# Patient Record
Sex: Female | Born: 1940 | Race: White | Hispanic: No | Marital: Married | Smoking: Never smoker
Health system: Southern US, Community
[De-identification: ages and names within clinical notes are randomized; demographics above are authoritative.]

## PROBLEM LIST (undated history)

## (undated) DIAGNOSIS — T8859XA Other complications of anesthesia, initial encounter: Secondary | ICD-10-CM

## (undated) DIAGNOSIS — Z9889 Other specified postprocedural states: Secondary | ICD-10-CM

## (undated) DIAGNOSIS — H269 Unspecified cataract: Secondary | ICD-10-CM

## (undated) DIAGNOSIS — T4145XA Adverse effect of unspecified anesthetic, initial encounter: Secondary | ICD-10-CM

## (undated) DIAGNOSIS — C801 Malignant (primary) neoplasm, unspecified: Secondary | ICD-10-CM

## (undated) DIAGNOSIS — C50919 Malignant neoplasm of unspecified site of unspecified female breast: Secondary | ICD-10-CM

## (undated) DIAGNOSIS — M8580 Other specified disorders of bone density and structure, unspecified site: Secondary | ICD-10-CM

## (undated) DIAGNOSIS — R51 Headache: Secondary | ICD-10-CM

## (undated) DIAGNOSIS — R519 Headache, unspecified: Secondary | ICD-10-CM

## (undated) DIAGNOSIS — R112 Nausea with vomiting, unspecified: Secondary | ICD-10-CM

## (undated) DIAGNOSIS — D219 Benign neoplasm of connective and other soft tissue, unspecified: Secondary | ICD-10-CM

## (undated) DIAGNOSIS — C44701 Unspecified malignant neoplasm of skin of unspecified lower limb, including hip: Secondary | ICD-10-CM

## (undated) DIAGNOSIS — E039 Hypothyroidism, unspecified: Secondary | ICD-10-CM

## (undated) HISTORY — DX: Benign neoplasm of connective and other soft tissue, unspecified: D21.9

## (undated) HISTORY — DX: Malignant neoplasm of unspecified site of unspecified female breast: C50.919

## (undated) HISTORY — PX: EYE SURGERY: SHX253

## (undated) HISTORY — DX: Malignant (primary) neoplasm, unspecified: C80.1

## (undated) HISTORY — PX: TONSILLECTOMY: SUR1361

## (undated) HISTORY — PX: BACK SURGERY: SHX140

## (undated) HISTORY — PX: SKIN CANCER EXCISION: SHX779

## (undated) HISTORY — DX: Unspecified malignant neoplasm of skin of unspecified lower limb, including hip: C44.701

## (undated) HISTORY — DX: Other specified disorders of bone density and structure, unspecified site: M85.80

---

## 1982-10-21 HISTORY — PX: THYROIDECTOMY: SHX17

## 1985-12-21 HISTORY — PX: LAMINECTOMY: SHX219

## 1995-12-22 HISTORY — PX: BREAST SURGERY: SHX581

## 1996-09-20 HISTORY — PX: MASTECTOMY: SHX3

## 2000-08-21 HISTORY — PX: LIPOMA EXCISION: SHX5283

## 2009-08-20 ENCOUNTER — Ambulatory Visit (HOSPITAL_COMMUNITY): Admission: RE | Admit: 2009-08-20 | Discharge: 2009-08-20 | Payer: Self-pay | Admitting: Obstetrics and Gynecology

## 2010-08-22 ENCOUNTER — Ambulatory Visit (HOSPITAL_COMMUNITY): Admission: RE | Admit: 2010-08-22 | Discharge: 2010-08-22 | Payer: Self-pay | Admitting: Obstetrics and Gynecology

## 2011-07-31 ENCOUNTER — Other Ambulatory Visit (HOSPITAL_COMMUNITY): Payer: Self-pay | Admitting: Obstetrics and Gynecology

## 2011-07-31 DIAGNOSIS — Z1231 Encounter for screening mammogram for malignant neoplasm of breast: Secondary | ICD-10-CM

## 2011-08-26 ENCOUNTER — Ambulatory Visit (HOSPITAL_COMMUNITY)
Admission: RE | Admit: 2011-08-26 | Discharge: 2011-08-26 | Disposition: A | Discharge status: Home / Self Care | Payer: Medicare Other | Source: Ambulatory Visit | Attending: Obstetrics and Gynecology | Admitting: Obstetrics and Gynecology

## 2011-08-26 DIAGNOSIS — Z1231 Encounter for screening mammogram for malignant neoplasm of breast: Secondary | ICD-10-CM | POA: Insufficient documentation

## 2012-01-11 DIAGNOSIS — S0990XA Unspecified injury of head, initial encounter: Secondary | ICD-10-CM | POA: Diagnosis not present

## 2012-01-16 DIAGNOSIS — H8309 Labyrinthitis, unspecified ear: Secondary | ICD-10-CM | POA: Diagnosis not present

## 2012-01-16 DIAGNOSIS — H6980 Other specified disorders of Eustachian tube, unspecified ear: Secondary | ICD-10-CM | POA: Diagnosis not present

## 2012-02-11 DIAGNOSIS — H6980 Other specified disorders of Eustachian tube, unspecified ear: Secondary | ICD-10-CM | POA: Diagnosis not present

## 2012-03-08 DIAGNOSIS — S0990XA Unspecified injury of head, initial encounter: Secondary | ICD-10-CM | POA: Diagnosis not present

## 2012-08-08 ENCOUNTER — Other Ambulatory Visit: Payer: Self-pay | Admitting: Obstetrics and Gynecology

## 2012-08-08 DIAGNOSIS — H2510 Age-related nuclear cataract, unspecified eye: Secondary | ICD-10-CM | POA: Diagnosis not present

## 2012-08-08 DIAGNOSIS — Z1231 Encounter for screening mammogram for malignant neoplasm of breast: Secondary | ICD-10-CM

## 2012-08-08 DIAGNOSIS — H04129 Dry eye syndrome of unspecified lacrimal gland: Secondary | ICD-10-CM | POA: Diagnosis not present

## 2012-08-23 DIAGNOSIS — Z23 Encounter for immunization: Secondary | ICD-10-CM | POA: Diagnosis not present

## 2012-08-23 DIAGNOSIS — Z131 Encounter for screening for diabetes mellitus: Secondary | ICD-10-CM | POA: Diagnosis not present

## 2012-08-23 DIAGNOSIS — R51 Headache: Secondary | ICD-10-CM | POA: Diagnosis not present

## 2012-08-23 DIAGNOSIS — E559 Vitamin D deficiency, unspecified: Secondary | ICD-10-CM | POA: Diagnosis not present

## 2012-08-23 DIAGNOSIS — E039 Hypothyroidism, unspecified: Secondary | ICD-10-CM | POA: Diagnosis not present

## 2012-08-26 ENCOUNTER — Ambulatory Visit (HOSPITAL_COMMUNITY)
Admission: RE | Admit: 2012-08-26 | Discharge: 2012-08-26 | Disposition: A | Discharge status: Home / Self Care | Payer: Medicare Other | Source: Ambulatory Visit | Attending: Obstetrics and Gynecology | Admitting: Obstetrics and Gynecology

## 2012-08-26 DIAGNOSIS — Z1231 Encounter for screening mammogram for malignant neoplasm of breast: Secondary | ICD-10-CM | POA: Diagnosis not present

## 2012-09-06 ENCOUNTER — Other Ambulatory Visit: Payer: Self-pay | Admitting: Family Medicine

## 2012-09-06 ENCOUNTER — Ambulatory Visit
Admission: RE | Admit: 2012-09-06 | Discharge: 2012-09-06 | Disposition: A | Discharge status: Home / Self Care | Payer: Commercial Managed Care - PPO | Source: Ambulatory Visit | Attending: Family Medicine | Admitting: Family Medicine

## 2012-09-06 ENCOUNTER — Ambulatory Visit
Admission: RE | Admit: 2012-09-06 | Discharge: 2012-09-06 | Disposition: A | Discharge status: Home / Self Care | Payer: Medicare Other | Source: Ambulatory Visit | Attending: Family Medicine | Admitting: Family Medicine

## 2012-09-06 DIAGNOSIS — Z043 Encounter for examination and observation following other accident: Secondary | ICD-10-CM | POA: Diagnosis not present

## 2012-09-06 DIAGNOSIS — R51 Headache: Secondary | ICD-10-CM | POA: Diagnosis not present

## 2012-09-06 MED ORDER — GADOBENATE DIMEGLUMINE 529 MG/ML IV SOLN
15.0000 mL | Freq: Once | INTRAVENOUS | Status: AC | PRN
  Administered 2012-09-06: 15 mL via INTRAVENOUS

## 2012-09-07 ENCOUNTER — Other Ambulatory Visit: Payer: Commercial Managed Care - PPO

## 2012-10-13 DIAGNOSIS — L719 Rosacea, unspecified: Secondary | ICD-10-CM | POA: Diagnosis not present

## 2012-10-13 DIAGNOSIS — L851 Acquired keratosis [keratoderma] palmaris et plantaris: Secondary | ICD-10-CM | POA: Diagnosis not present

## 2012-10-21 ENCOUNTER — Ambulatory Visit: Payer: Medicare Other

## 2012-10-21 ENCOUNTER — Encounter: Payer: Self-pay | Admitting: Obstetrics and Gynecology

## 2012-10-21 ENCOUNTER — Ambulatory Visit (INDEPENDENT_AMBULATORY_CARE_PROVIDER_SITE_OTHER): Payer: Medicare Other | Admitting: Obstetrics and Gynecology

## 2012-10-21 ENCOUNTER — Other Ambulatory Visit: Payer: Medicare Other

## 2012-10-21 VITALS — BP 122/68 | Ht 66.0 in | Wt 184.0 lb

## 2012-10-21 DIAGNOSIS — M8580 Other specified disorders of bone density and structure, unspecified site: Secondary | ICD-10-CM

## 2012-10-21 DIAGNOSIS — Z01419 Encounter for gynecological examination (general) (routine) without abnormal findings: Secondary | ICD-10-CM | POA: Diagnosis not present

## 2012-10-21 DIAGNOSIS — Z853 Personal history of malignant neoplasm of breast: Secondary | ICD-10-CM | POA: Insufficient documentation

## 2012-10-21 DIAGNOSIS — Z124 Encounter for screening for malignant neoplasm of cervix: Secondary | ICD-10-CM

## 2012-10-21 DIAGNOSIS — C801 Malignant (primary) neoplasm, unspecified: Secondary | ICD-10-CM | POA: Insufficient documentation

## 2012-10-21 DIAGNOSIS — M899 Disorder of bone, unspecified: Secondary | ICD-10-CM

## 2012-10-21 DIAGNOSIS — C44701 Unspecified malignant neoplasm of skin of unspecified lower limb, including hip: Secondary | ICD-10-CM | POA: Insufficient documentation

## 2012-10-21 DIAGNOSIS — D219 Benign neoplasm of connective and other soft tissue, unspecified: Secondary | ICD-10-CM | POA: Insufficient documentation

## 2012-10-21 DIAGNOSIS — M949 Disorder of cartilage, unspecified: Secondary | ICD-10-CM | POA: Diagnosis not present

## 2012-10-21 NOTE — Progress Notes (Signed)
The patient is not taking hormone replacement therapy The patient  is taking a Calcium supplement. Post-menopausal bleeding:no  Last Pap: approximate date 10/08/2011 and was normal  Last mammogram: approximate date 08/08/2012 and was normal  Last DEXA scan : T= -0.6  09/18/2010 Last colonoscopy:2010 Due in 2015  Urinary symptoms: none Normal bowel movements: Yes Reports abuse at home: No    Subjective:    Dawn Riggs is a 71 y.o. female G2P2 who presents for annual exam.  The patient has no complaints today.   The following portions of the patient's history were reviewed and updated as appropriate: allergies, current medications, past family history, past medical history, past social history, past surgical history and problem list.  Review of Systems Pertinent items are noted in HPI. Gastrointestinal:No change in bowel habits, no abdominal pain, no rectal bleeding Genitourinary:negative for dysuria, frequency, hematuria, nocturia and urinary incontinence    Objective:     There were no vitals taken for this visit.  Weight:  Wt Readings from Last 1 Encounters:  No data found for Wt     BMI: There is no height or weight on file to calculate BMI. General Appearance: Alert, appropriate appearance for age. No acute distress HEENT: Grossly normal Neck / Thyroid: Supple, no masses, nodes or enlargement Lungs: clear to auscultation bilaterally Back: No CVA tenderness Breast Exam: No masses or nodes.No dimpling, nipple retraction or discharge. Cardiovascular: Regular rate and rhythm. S1, S2, no murmur Gastrointestinal: Soft, non-tender, no masses or organomegaly Pelvic Exam: Vulva and vagina appear normal. Bimanual exam reveals normal uterus and adnexa. Rectovaginal: normal rectal, no masses Lymphatic Exam: Non-palpable nodes in neck, clavicular, axillary, or inguinal regions Skin: no rash or abnormalities Neurologic: Normal gait and speech, no tremor  Psychiatric: Alert and  oriented, appropriate affect.     Assessment:    Normal gyn exam  Breast cancer survivor Osteopenia   Plan:   mammogram pap smear return annually or prn DEXA today:    Silverio Lay MD

## 2012-10-24 LAB — PAP IG W/ RFLX HPV ASCU

## 2013-07-31 ENCOUNTER — Other Ambulatory Visit: Payer: Self-pay | Admitting: Obstetrics and Gynecology

## 2013-07-31 DIAGNOSIS — Z1231 Encounter for screening mammogram for malignant neoplasm of breast: Secondary | ICD-10-CM

## 2013-08-09 DIAGNOSIS — H04129 Dry eye syndrome of unspecified lacrimal gland: Secondary | ICD-10-CM | POA: Diagnosis not present

## 2013-08-09 DIAGNOSIS — H2510 Age-related nuclear cataract, unspecified eye: Secondary | ICD-10-CM | POA: Diagnosis not present

## 2013-08-11 DIAGNOSIS — R1011 Right upper quadrant pain: Secondary | ICD-10-CM | POA: Diagnosis not present

## 2013-08-14 ENCOUNTER — Other Ambulatory Visit: Payer: Self-pay | Admitting: Obstetrics and Gynecology

## 2013-08-14 ENCOUNTER — Other Ambulatory Visit: Payer: Self-pay | Admitting: Family Medicine

## 2013-08-14 DIAGNOSIS — R1011 Right upper quadrant pain: Secondary | ICD-10-CM

## 2013-08-16 ENCOUNTER — Ambulatory Visit
Admission: RE | Admit: 2013-08-16 | Discharge: 2013-08-16 | Disposition: A | Discharge status: Home / Self Care | Payer: Medicare Other | Source: Ambulatory Visit | Attending: Family Medicine | Admitting: Family Medicine

## 2013-08-16 DIAGNOSIS — K8020 Calculus of gallbladder without cholecystitis without obstruction: Secondary | ICD-10-CM | POA: Diagnosis not present

## 2013-08-16 DIAGNOSIS — R1011 Right upper quadrant pain: Secondary | ICD-10-CM

## 2013-08-24 ENCOUNTER — Ambulatory Visit (INDEPENDENT_AMBULATORY_CARE_PROVIDER_SITE_OTHER): Payer: Medicare Other | Admitting: General Surgery

## 2013-08-24 ENCOUNTER — Encounter (INDEPENDENT_AMBULATORY_CARE_PROVIDER_SITE_OTHER): Payer: Self-pay | Admitting: General Surgery

## 2013-08-24 VITALS — BP 132/82 | HR 74 | Resp 18 | Ht 66.5 in | Wt 175.0 lb

## 2013-08-24 DIAGNOSIS — K8020 Calculus of gallbladder without cholecystitis without obstruction: Secondary | ICD-10-CM

## 2013-08-24 NOTE — Patient Instructions (Signed)
Plan for lap chole with ioc 

## 2013-08-24 NOTE — Progress Notes (Signed)
Patient ID: Dawn Riggs, female   DOB: April 02, 1941, 72 y.o.   MRN: [id number]  Chief Complaint  Patient presents with  . Other    Eval gallbladder    HPI Dawn Riggs is a 72 y.o. female.  We are asked to see the patient in consultation by Dr. Corliss Blacker to evaluate her for gallstones. The patient is a 72 year old white female who had her first attack of abdominal pain in February. She has had several attacks since then. Her most recent attack was about 2 weeks ago. She experienced diarrhea and crampy abdominal pain. Some of this pain was localized to the right upper quadrant. The pain occurred about 2 hours after eating dinner. She had severe nausea but no vomiting. It took about 24 hours for the severe pain to go away in about 4-5 days to feel normal again. She had an ultrasound that did show stones in her gallbladder but no gallbladder wall thickening or duct dilatation.  HPI  Past Medical History  Diagnosis Date  . Medullary carcinoma   . Breast cancer   . Osteopenia   . Fibroids   . Cancer of skin of leg     Past Surgical History  Procedure Laterality Date  . Thyroidectomy  [id number]    Dr Levora Dredge  . Laminectomy  [id number]  . Mastectomy  [id number]    Dr Desmond LopeLura Em  . Lipoma excision  08/2000    Family History  Problem Relation Age of Onset  . Hypertension Mother   . Stroke Mother   . Early death Father   . Cancer Brother     lung  . Heart disease Brother   . Cancer Maternal Grandfather     non hodgkins  . Cancer Paternal Grandfather     skin  . Cancer Brother 78    leukemia    Social History History  Substance Use Topics  . Smoking status: Never Smoker   . Smokeless tobacco: Never Used  . Alcohol Use: No    Not on File  Current Outpatient Prescriptions  Medication Sig Dispense Refill  . HYDROcodone-acetaminophen (NORCO/VICODIN) 5-325 MG per tablet Take 1 tablet by mouth every 6 (six) hours as needed for pain.      Marland Kitchen levothyroxine  (UNITHROID) 112 MCG tablet Take 112 mcg by mouth daily.      . Multiple Vitamins-Minerals (CENTRUM SILVER ADULT 50+) TABS Take by mouth.       No current facility-administered medications for this visit.    Review of Systems Review of Systems  Constitutional: Negative.   HENT: Negative.   Eyes: Negative.   Respiratory: Negative.   Cardiovascular: Negative.   Gastrointestinal: Positive for nausea, abdominal pain, diarrhea and abdominal distention.  Endocrine: Negative.   Genitourinary: Negative.   Musculoskeletal: Negative.   Skin: Negative.   Allergic/Immunologic: Negative.   Neurological: Negative.   Hematological: Negative.   Psychiatric/Behavioral: Negative.     Blood pressure 132/82, pulse 74, resp. rate 18, height 5' 6.5" (1.689 m), weight 175 lb (79.379 kg), last menstrual period 12/22/1995.  Physical Exam Physical Exam  Constitutional: She is oriented to person, place, and time. She appears well-developed and well-nourished.  HENT:  Head: Normocephalic and atraumatic.  Eyes: Conjunctivae and EOM are normal. Pupils are equal, round, and reactive to light.  Neck: Normal range of motion. Neck supple.  Cardiovascular: Normal rate, regular rhythm and normal heart sounds.   Pulmonary/Chest: Effort normal and breath sounds normal.  Abdominal: Soft. Bowel sounds are normal.  She exhibits no mass. There is no tenderness.  Musculoskeletal: Normal range of motion.  Neurological: She is alert and oriented to person, place, and time.  Skin: Skin is warm and dry.  Psychiatric: She has a normal mood and affect. Her behavior is normal.    Data Reviewed As above  Assessment    The patient appears to have symptomatic gallstones. Because of the risk of further painful episodes and possible pancreatitis I think she would benefit from having her gallbladder removed. She would also like to have this done. I have discussed with her in detail the risks and benefits of the operation to  remove the gallbladder as well as some of the technical aspects and she understands and wishes to proceed     Plan    Plan for laparoscopic cholecystectomy with intraoperative cholangiogram        TOTH III,Philopateer Strine S 08/24/2013, 2:39 PM

## 2013-08-28 ENCOUNTER — Ambulatory Visit (HOSPITAL_COMMUNITY)
Admission: RE | Admit: 2013-08-28 | Discharge: 2013-08-28 | Disposition: A | Discharge status: Home / Self Care | Payer: Medicare Other | Source: Ambulatory Visit | Attending: Obstetrics and Gynecology | Admitting: Obstetrics and Gynecology

## 2013-08-28 ENCOUNTER — Other Ambulatory Visit: Payer: Self-pay | Admitting: Obstetrics and Gynecology

## 2013-08-28 DIAGNOSIS — Z1231 Encounter for screening mammogram for malignant neoplasm of breast: Secondary | ICD-10-CM | POA: Diagnosis not present

## 2013-08-31 ENCOUNTER — Encounter (HOSPITAL_COMMUNITY): Payer: Self-pay | Admitting: Pharmacy Technician

## 2013-09-04 NOTE — Pre-Procedure Instructions (Signed)
AARIYANA MANZ  09/04/2013   Your procedure is scheduled on:  Thursday, September 18th.  Report to [facility], Main Entrance [facility] "A"at 7:30 AM.  Call this number if you have problems the morning of surgery: [phone]   Remember:   Do not eat food or drink liquids after midnight.    Take these medicines the morning of surgery with A SIP OF WATER: levothyroxine (UNITHROID).  Take if needed: HYDROcodone-acetaminophen (NORCO/VICODIN).  Stop taking Aspirin, Coumadin, Plavix, Effient and Herbal medications.  Do not take any NSAIDs ie: Ibuprofen,  Advil,Naproxen or any medication containing Aspirin.  Do not wear jewelry, make-up or nail polish.  Do not wear lotions, powders, or perfumes. You may wear deodorant.  Do not shave 48 hours prior to surgery.  Do not bring valuables to the hospital.  [facility] is not responsible  for any belongings or valuables.  Contacts, dentures or bridgework may not be worn into surgery.  Leave suitcase in the car. After surgery it may be brought to your room.  For patients admitted to the hospital, checkout time is 11:00 AM the day of discharge.   Patients discharged the day of surgery will not be allowed to drive home.  Name and phone number of your driver: -   Special Instructions: Shower using CHG 2 nights before surgery and the night before surgery.  If you shower the day of surgery use CHG.  Use special wash - you have one bottle of CHG for all showers.  You should use approximately 1/3 of the bottle for each shower.   Please read over the following fact sheets that you were given: Pain Booklet, Coughing and Deep Breathing and Surgical Site Infection Prevention

## 2013-09-05 ENCOUNTER — Encounter (HOSPITAL_COMMUNITY)
Admission: RE | Admit: 2013-09-05 | Discharge: 2013-09-05 | Disposition: A | Discharge status: Home / Self Care | Payer: Medicare Other | Source: Ambulatory Visit | Attending: General Surgery | Admitting: General Surgery

## 2013-09-05 ENCOUNTER — Encounter (HOSPITAL_COMMUNITY): Payer: Self-pay

## 2013-09-05 DIAGNOSIS — Z01812 Encounter for preprocedural laboratory examination: Secondary | ICD-10-CM | POA: Diagnosis not present

## 2013-09-05 DIAGNOSIS — K8020 Calculus of gallbladder without cholecystitis without obstruction: Secondary | ICD-10-CM | POA: Diagnosis not present

## 2013-09-05 DIAGNOSIS — Z853 Personal history of malignant neoplasm of breast: Secondary | ICD-10-CM | POA: Diagnosis not present

## 2013-09-05 HISTORY — DX: Other specified postprocedural states: Z98.890

## 2013-09-05 HISTORY — DX: Adverse effect of unspecified anesthetic, initial encounter: T41.45XA

## 2013-09-05 HISTORY — DX: Hypothyroidism, unspecified: E03.9

## 2013-09-05 HISTORY — DX: Unspecified cataract: H26.9

## 2013-09-05 HISTORY — DX: Nausea with vomiting, unspecified: R11.2

## 2013-09-05 HISTORY — DX: Other complications of anesthesia, initial encounter: T88.59XA

## 2013-09-05 LAB — COMPREHENSIVE METABOLIC PANEL
ALT: 33 U/L (ref 0–35)
AST: 20 U/L (ref 0–37)
Albumin: 3.8 g/dL (ref 3.5–5.2)
Alkaline Phosphatase: 88 U/L (ref 39–117)
CO2: 27 mEq/L (ref 19–32)
Chloride: 99 mEq/L (ref 96–112)
Potassium: 4.1 mEq/L (ref 3.5–5.1)
Sodium: 134 mEq/L — ABNORMAL LOW (ref 135–145)
Total Bilirubin: 0.4 mg/dL (ref 0.3–1.2)

## 2013-09-05 LAB — CBC WITH DIFFERENTIAL/PLATELET
Basophils Absolute: 0 10*3/uL (ref 0.0–0.1)
Basophils Relative: 0 % (ref 0–1)
Lymphocytes Relative: 23 % (ref 12–46)
MCHC: 35.4 g/dL (ref 30.0–36.0)
Neutro Abs: 3.4 10*3/uL (ref 1.7–7.7)
Neutrophils Relative %: 65 % (ref 43–77)
RDW: 13.2 % (ref 11.5–15.5)
WBC: 5.2 10*3/uL (ref 4.0–10.5)

## 2013-09-06 ENCOUNTER — Encounter (HOSPITAL_COMMUNITY): Payer: Self-pay | Admitting: *Deleted

## 2013-09-06 MED ORDER — CEFAZOLIN SODIUM-DEXTROSE 2-3 GM-% IV SOLR
2.0000 g | INTRAVENOUS | Status: AC
  Administered 2013-09-07: 2 g via INTRAVENOUS
  Filled 2013-09-06: qty 50

## 2013-09-07 ENCOUNTER — Ambulatory Visit (HOSPITAL_COMMUNITY): Payer: Medicare Other | Admitting: *Deleted

## 2013-09-07 ENCOUNTER — Encounter (HOSPITAL_COMMUNITY): Payer: Self-pay | Admitting: *Deleted

## 2013-09-07 ENCOUNTER — Ambulatory Visit (HOSPITAL_COMMUNITY)
Admission: RE | Admit: 2013-09-07 | Discharge: 2013-09-07 | Disposition: A | Discharge status: Home / Self Care | Payer: Medicare Other | Source: Ambulatory Visit | Attending: General Surgery | Admitting: General Surgery

## 2013-09-07 ENCOUNTER — Encounter (HOSPITAL_COMMUNITY)
Admission: RE | Disposition: A | Discharge status: Home / Self Care | Payer: Self-pay | Source: Ambulatory Visit | Attending: General Surgery

## 2013-09-07 ENCOUNTER — Ambulatory Visit (HOSPITAL_COMMUNITY): Payer: Medicare Other

## 2013-09-07 DIAGNOSIS — Z853 Personal history of malignant neoplasm of breast: Secondary | ICD-10-CM | POA: Insufficient documentation

## 2013-09-07 DIAGNOSIS — K8020 Calculus of gallbladder without cholecystitis without obstruction: Secondary | ICD-10-CM | POA: Insufficient documentation

## 2013-09-07 DIAGNOSIS — C50919 Malignant neoplasm of unspecified site of unspecified female breast: Secondary | ICD-10-CM | POA: Diagnosis not present

## 2013-09-07 DIAGNOSIS — K8010 Calculus of gallbladder with chronic cholecystitis without obstruction: Secondary | ICD-10-CM | POA: Diagnosis not present

## 2013-09-07 DIAGNOSIS — Z01812 Encounter for preprocedural laboratory examination: Secondary | ICD-10-CM | POA: Diagnosis not present

## 2013-09-07 HISTORY — PX: CHOLECYSTECTOMY: SHX55

## 2013-09-07 SURGERY — LAPAROSCOPIC CHOLECYSTECTOMY WITH INTRAOPERATIVE CHOLANGIOGRAM
Anesthesia: General | Site: Abdomen | Wound class: Clean

## 2013-09-07 MED ORDER — EPHEDRINE SULFATE 50 MG/ML IJ SOLN
INTRAMUSCULAR | Status: DC | PRN
  Administered 2013-09-07: 10 mg via INTRAVENOUS
  Administered 2013-09-07: 5 mg via INTRAVENOUS

## 2013-09-07 MED ORDER — LIDOCAINE HCL (CARDIAC) 20 MG/ML IV SOLN
INTRAVENOUS | Status: DC | PRN
  Administered 2013-09-07: 70 mg via INTRAVENOUS

## 2013-09-07 MED ORDER — BUPIVACAINE HCL (PF) 0.25 % IJ SOLN
INTRAMUSCULAR | Status: AC
  Filled 2013-09-07: qty 30

## 2013-09-07 MED ORDER — SODIUM CHLORIDE 0.9 % IV SOLN
INTRAVENOUS | Status: DC | PRN
  Administered 2013-09-07: 12:00:00

## 2013-09-07 MED ORDER — NEOSTIGMINE METHYLSULFATE 1 MG/ML IJ SOLN
INTRAMUSCULAR | Status: DC | PRN
  Administered 2013-09-07: 3 mg via INTRAVENOUS

## 2013-09-07 MED ORDER — GLYCOPYRROLATE 0.2 MG/ML IJ SOLN
INTRAMUSCULAR | Status: DC | PRN
  Administered 2013-09-07: 0.6 mg via INTRAVENOUS

## 2013-09-07 MED ORDER — MEPERIDINE HCL 25 MG/ML IJ SOLN: 6.2500 mg | INTRAMUSCULAR | Status: DC | PRN

## 2013-09-07 MED ORDER — CHLORHEXIDINE GLUCONATE 4 % EX LIQD: 1.0000 "application " | Freq: Once | CUTANEOUS | Status: DC

## 2013-09-07 MED ORDER — LACTATED RINGERS IV SOLN
INTRAVENOUS | Status: DC
  Administered 2013-09-07: 08:00:00 via INTRAVENOUS

## 2013-09-07 MED ORDER — FENTANYL CITRATE 0.05 MG/ML IJ SOLN
INTRAMUSCULAR | Status: DC | PRN
  Administered 2013-09-07: 50 ug via INTRAVENOUS
  Administered 2013-09-07: 150 ug via INTRAVENOUS

## 2013-09-07 MED ORDER — ROCURONIUM BROMIDE 100 MG/10ML IV SOLN
INTRAVENOUS | Status: DC | PRN
  Administered 2013-09-07: 35 mg via INTRAVENOUS

## 2013-09-07 MED ORDER — OXYCODONE HCL 5 MG PO TABS: 5.0000 mg | ORAL_TABLET | Freq: Once | ORAL | Status: DC | PRN

## 2013-09-07 MED ORDER — ONDANSETRON HCL 4 MG/2ML IJ SOLN
INTRAMUSCULAR | Status: DC | PRN
  Administered 2013-09-07: 4 mg via INTRAVENOUS

## 2013-09-07 MED ORDER — HYDROCODONE-ACETAMINOPHEN 5-325 MG PO TABS: 1.0000 | ORAL_TABLET | ORAL | Status: DC | PRN

## 2013-09-07 MED ORDER — PROPOFOL 10 MG/ML IV BOLUS
INTRAVENOUS | Status: DC | PRN
  Administered 2013-09-07: 200 mg via INTRAVENOUS

## 2013-09-07 MED ORDER — ONDANSETRON HCL 4 MG/2ML IJ SOLN: 4.0000 mg | Freq: Once | INTRAMUSCULAR | Status: DC | PRN

## 2013-09-07 MED ORDER — BUPIVACAINE-EPINEPHRINE 0.25% -1:200000 IJ SOLN
INTRAMUSCULAR | Status: DC | PRN
  Administered 2013-09-07: 28 mL

## 2013-09-07 MED ORDER — OXYCODONE HCL 5 MG/5ML PO SOLN: 5.0000 mg | Freq: Once | ORAL | Status: DC | PRN

## 2013-09-07 MED ORDER — LACTATED RINGERS IV SOLN
INTRAVENOUS | Status: DC | PRN
  Administered 2013-09-07 (×2): via INTRAVENOUS

## 2013-09-07 MED ORDER — 0.9 % SODIUM CHLORIDE (POUR BTL) OPTIME
TOPICAL | Status: DC | PRN
  Administered 2013-09-07: 1000 mL

## 2013-09-07 MED ORDER — SODIUM CHLORIDE 0.9 % IR SOLN
Status: DC | PRN
  Administered 2013-09-07: 1000 mL

## 2013-09-07 MED ORDER — HYDROMORPHONE HCL PF 1 MG/ML IJ SOLN
INTRAMUSCULAR | Status: AC
  Filled 2013-09-07: qty 1

## 2013-09-07 MED ORDER — HYDROMORPHONE HCL PF 1 MG/ML IJ SOLN
0.2500 mg | INTRAMUSCULAR | Status: DC | PRN
  Administered 2013-09-07 (×2): 0.25 mg via INTRAVENOUS
  Administered 2013-09-07: 0.5 mg via INTRAVENOUS

## 2013-09-07 SURGICAL SUPPLY — 44 items
APPLIER CLIP ROT 10 11.4 M/L (STAPLE) ×2
BLADE SURG ROTATE 9660 (MISCELLANEOUS) IMPLANT
CANISTER SUCTION 2500CC (MISCELLANEOUS) ×2 IMPLANT
CATH REDDICK CHOLANGI 4FR 50CM (CATHETERS) ×2 IMPLANT
CHLORAPREP W/TINT 26ML (MISCELLANEOUS) ×2 IMPLANT
CLIP APPLIE ROT 10 11.4 M/L (STAPLE) ×1 IMPLANT
CLOTH BEACON ORANGE TIMEOUT ST (SAFETY) ×2 IMPLANT
COVER MAYO STAND STRL (DRAPES) ×2 IMPLANT
COVER SURGICAL LIGHT HANDLE (MISCELLANEOUS) ×2 IMPLANT
DECANTER SPIKE VIAL GLASS SM (MISCELLANEOUS) ×4 IMPLANT
DERMABOND ADVANCED (GAUZE/BANDAGES/DRESSINGS) ×1
DERMABOND ADVANCED .7 DNX12 (GAUZE/BANDAGES/DRESSINGS) ×1 IMPLANT
DRAPE C-ARM 42X72 X-RAY (DRAPES) ×2 IMPLANT
DRAPE UTILITY 15X26 W/TAPE STR (DRAPE) ×4 IMPLANT
ELECT REM PT RETURN 9FT ADLT (ELECTROSURGICAL) ×2
ELECTRODE REM PT RTRN 9FT ADLT (ELECTROSURGICAL) ×1 IMPLANT
GLOVE BIO SURGEON STRL SZ 6.5 (GLOVE) ×2 IMPLANT
GLOVE BIO SURGEON STRL SZ7.5 (GLOVE) ×6 IMPLANT
GLOVE BIOGEL PI IND STRL 6.5 (GLOVE) ×1 IMPLANT
GLOVE BIOGEL PI IND STRL 8 (GLOVE) ×1 IMPLANT
GLOVE BIOGEL PI INDICATOR 6.5 (GLOVE) ×1
GLOVE BIOGEL PI INDICATOR 8 (GLOVE) ×1
GLOVE ORTHOPEDIC STR SZ6.5 (GLOVE) ×2 IMPLANT
GOWN EXTRA PROTECTION XL (GOWNS) ×2 IMPLANT
GOWN STRL NON-REIN LRG LVL3 (GOWN DISPOSABLE) ×12 IMPLANT
IV CATH 14GX2 1/4 (CATHETERS) ×2 IMPLANT
KIT BASIN OR (CUSTOM PROCEDURE TRAY) ×2 IMPLANT
KIT ROOM TURNOVER OR (KITS) ×2 IMPLANT
NS IRRIG 1000ML POUR BTL (IV SOLUTION) ×2 IMPLANT
PAD ARMBOARD 7.5X6 YLW CONV (MISCELLANEOUS) ×2 IMPLANT
POUCH SPECIMEN RETRIEVAL 10MM (ENDOMECHANICALS) ×2 IMPLANT
SCISSORS LAP 5X35 DISP (ENDOMECHANICALS) ×2 IMPLANT
SET IRRIG TUBING LAPAROSCOPIC (IRRIGATION / IRRIGATOR) ×2 IMPLANT
SLEEVE ENDOPATH XCEL 5M (ENDOMECHANICALS) ×2 IMPLANT
SLEEVE SURGEON STRL (DRAPES) ×2 IMPLANT
SPECIMEN JAR SMALL (MISCELLANEOUS) ×2 IMPLANT
SUT MNCRL AB 4-0 PS2 18 (SUTURE) ×2 IMPLANT
SUT VICRYL 0 UR6 27IN ABS (SUTURE) ×2 IMPLANT
TOWEL OR 17X24 6PK STRL BLUE (TOWEL DISPOSABLE) ×2 IMPLANT
TOWEL OR 17X26 10 PK STRL BLUE (TOWEL DISPOSABLE) ×2 IMPLANT
TRAY LAPAROSCOPIC (CUSTOM PROCEDURE TRAY) ×2 IMPLANT
TROCAR XCEL BLUNT TIP 100MML (ENDOMECHANICALS) ×2 IMPLANT
TROCAR XCEL NON-BLD 11X100MML (ENDOMECHANICALS) ×2 IMPLANT
TROCAR XCEL NON-BLD 5MMX100MML (ENDOMECHANICALS) ×2 IMPLANT

## 2013-09-07 NOTE — H&P (View-Only) (Signed)
Patient ID: Dawn Riggs, female   DOB: 09/24/1941, 72 y.o.   MRN: [id number]  Chief Complaint  Patient presents with  . Other    Eval gallbladder    HPI Dawn Riggs is a 72 y.o. female.  We are asked to see the patient in consultation by Dr. McNeill to evaluate her for gallstones. The patient is a 72-year-old white female who had her first attack of abdominal pain in February. She has had several attacks since then. Her most recent attack was about 2 weeks ago. She experienced diarrhea and crampy abdominal pain. Some of this pain was localized to the right upper quadrant. The pain occurred about 2 hours after eating dinner. She had severe nausea but no vomiting. It took about 24 hours for the severe pain to go away in about 4-5 days to feel normal again. She had an ultrasound that did show stones in her gallbladder but no gallbladder wall thickening or duct dilatation.  HPI  Past Medical History  Diagnosis Date  . Medullary carcinoma   . Breast cancer   . Osteopenia   . Fibroids   . Cancer of skin of leg     Past Surgical History  Procedure Laterality Date  . Thyroidectomy  10-1982    Dr Shine[place]  . Laminectomy  12-1985  . Mastectomy  09-1996    Dr Turk- [place]  . Lipoma excision  08/2000    Family History  Problem Relation Age of Onset  . Hypertension Mother   . Stroke Mother   . Early death Father   . Cancer Brother     lung  . Heart disease Brother   . Cancer Maternal Grandfather     non hodgkins  . Cancer Paternal Grandfather     skin  . Cancer Brother 71    leukemia    Social History History  Substance Use Topics  . Smoking status: Never Smoker   . Smokeless tobacco: Never Used  . Alcohol Use: No    Not on File  Current Outpatient Prescriptions  Medication Sig Dispense Refill  . HYDROcodone-acetaminophen (NORCO/VICODIN) 5-325 MG per tablet Take 1 tablet by mouth every 6 (six) hours as needed for pain.      . levothyroxine  (UNITHROID) 112 MCG tablet Take 112 mcg by mouth daily.      . Multiple Vitamins-Minerals (CENTRUM SILVER ADULT 50+) TABS Take by mouth.       No current facility-administered medications for this visit.    Review of Systems Review of Systems  Constitutional: Negative.   HENT: Negative.   Eyes: Negative.   Respiratory: Negative.   Cardiovascular: Negative.   Gastrointestinal: Positive for nausea, abdominal pain, diarrhea and abdominal distention.  Endocrine: Negative.   Genitourinary: Negative.   Musculoskeletal: Negative.   Skin: Negative.   Allergic/Immunologic: Negative.   Neurological: Negative.   Hematological: Negative.   Psychiatric/Behavioral: Negative.     Blood pressure 132/82, pulse 74, resp. rate 18, height 5' 6.5" (1.689 m), weight 175 lb (79.379 kg), last menstrual period 12/22/1995.  Physical Exam Physical Exam  Constitutional: She is oriented to person, place, and time. She appears well-developed and well-nourished.  HENT:  Head: Normocephalic and atraumatic.  Eyes: Conjunctivae and EOM are normal. Pupils are equal, round, and reactive to light.  Neck: Normal range of motion. Neck supple.  Cardiovascular: Normal rate, regular rhythm and normal heart sounds.   Pulmonary/Chest: Effort normal and breath sounds normal.  Abdominal: Soft. Bowel sounds are normal.   She exhibits no mass. There is no tenderness.  Musculoskeletal: Normal range of motion.  Neurological: She is alert and oriented to person, place, and time.  Skin: Skin is warm and dry.  Psychiatric: She has a normal mood and affect. Her behavior is normal.    Data Reviewed As above  Assessment    The patient appears to have symptomatic gallstones. Because of the risk of further painful episodes and possible pancreatitis I think she would benefit from having her gallbladder removed. She would also like to have this done. I have discussed with her in detail the risks and benefits of the operation to  remove the gallbladder as well as some of the technical aspects and she understands and wishes to proceed     Plan    Plan for laparoscopic cholecystectomy with intraoperative cholangiogram        TOTH III,PAUL S 08/24/2013, 2:39 PM    

## 2013-09-07 NOTE — Interval H&P Note (Signed)
History and Physical Interval Note:  09/07/2013 8:04 AM  Dawn Riggs  has presented today for surgery, with the diagnosis of gallstones  The various methods of treatment have been discussed with the patient and family. After consideration of risks, benefits and other options for treatment, the patient has consented to  Procedure(s): LAPAROSCOPIC CHOLECYSTECTOMY WITH INTRAOPERATIVE CHOLANGIOGRAM (N/A) as a surgical intervention .  The patient's history has been reviewed, patient examined, no change in status, stable for surgery.  I have reviewed the patient's chart and labs.  Questions were answered to the patient's satisfaction.     TOTH III,Adelaide Pfefferkorn S

## 2013-09-07 NOTE — Progress Notes (Signed)
Pt was able to go to bathroom and void without difficulties

## 2013-09-07 NOTE — Op Note (Signed)
09/07/2013  1:04 PM  PATIENT:  Dawn Riggs  72 y.o. female  PRE-OPERATIVE DIAGNOSIS:  gallstones  POST-OPERATIVE DIAGNOSIS:  gallstones  PROCEDURE:  Procedure(s): LAPAROSCOPIC CHOLECYSTECTOMY WITH INTRAOPERATIVE CHOLANGIOGRAM (N/A)  SURGEON:  Surgeon(s) and Role:    * Robyne Askew, MD - Primary    * Atilano Ina, MD - Assisting  PHYSICIAN ASSISTANT:   ASSISTANTS: Dr. Andrey Campanile   ANESTHESIA:   general  EBL:  Total I/O In: 1000 [I.V.:1000] Out: -   BLOOD ADMINISTERED:none  DRAINS: none   LOCAL MEDICATIONS USED:  MARCAINE     SPECIMEN:  Source of Specimen:  gallbladder  DISPOSITION OF SPECIMEN:  PATHOLOGY  COUNTS:  YES  TOURNIQUET:  * No tourniquets in log *  DICTATION: .Dragon Dictation  Procedure: After informed consent was obtained the patient was brought to the operating room and placed in the supine position on the operating room table. After adequate induction of general anesthesia the patient's abdomen was prepped with ChloraPrep allowed to dry and draped in usual sterile manner. The area below the umbilicus was infiltrated with quarter percent  Marcaine. A small incision was made with a 15 blade knife. The incision was carried down through the subcutaneous tissue bluntly with a hemostat and [employer] retractors. The linea alba was identified. The linea alba was incised with a 15 blade knife and each side was grasped with Coker clamps. The preperitoneal space was then probed with a hemostat until the peritoneum was opened and access was gained to the abdominal cavity. A 0 Vicryl pursestring stitch was placed in the fascia surrounding the opening. A Hassan cannula was then placed through the opening and anchored in place with the previously placed Vicryl purse string stitch. The abdomen was insufflated with carbon dioxide without difficulty. A laparoscope was inserted through the [facility] cannula in the right upper quadrant was inspected. Next the epigastric region  was infiltrated with % Marcaine. A small incision was made with a 15 blade knife. A 10 mm port was placed bluntly through this incision into the abdominal cavity under direct vision. Next 2 sites were chosen laterally on the right side of the abdomen for placement of 5 mm ports. Each of these areas was infiltrated with quarter percent Marcaine. Small stab incisions were made with a 15 blade knife. 5 mm ports were then placed bluntly through these incisions into the abdominal cavity under direct vision without difficulty. A blunt grasper was placed through the lateralmost 5 mm port and used to grasp the dome of the gallbladder and elevated anteriorly and superiorly. Another blunt grasper was placed through the other 5 mm port and used to retract the body and neck of the gallbladder. A dissector was placed through the epigastric port and using the electrocautery the peritoneal reflection at the gallbladder neck was opened. Blunt dissection was then carried out in this area until the gallbladder neck-cystic duct junction was readily identified and a good window was created. A single clip was placed on the gallbladder neck. A small  ductotomy was made just below the clip with laparoscopic scissors. A 14-gauge Angiocath was then placed through the anterior abdominal wall under direct vision. A Reddick cholangiogram catheter was then placed through the Angiocath and flushed. The catheter was then placed in the cystic duct and anchored in place with a clip. A cholangiogram was obtained that showed no filling defects good emptying into the duodenum an adequate length on the cystic duct. The anchoring clip and catheters were  then removed from the patient. 3 clips were placed proximally on the cystic duct and the duct was divided between the 2 sets of clips. Posterior to this the cystic artery was identified and again dissected bluntly in a circumferential manner until a good window  was created. 2 clips were placed  proximally and one distally on the artery and the artery was divided between the 2 sets of clips. Next a laparoscopic hook cautery device was used to separate the gallbladder from the liver bed. Prior to completely detaching the gallbladder from the liver bed the liver bed was inspected and several small bleeding points were coagulated with the electrocautery until the area was completely hemostatic. The gallbladder was then detached the rest of it from the liver bed without difficulty. A laparoscopic bag was inserted through the epigastric port. The gallbladder was placed within the bag and the bag was sealed. A laparoscope was then moved to the epigastric port. The gallbladder grasper was placed through the [facility] cannula and used to grasp the opening of the bag. The bag with the gallbladder was then removed with the [facility] cannula through the infraumbilical port without difficulty. The fascial defect was then closed with the previously placed Vicryl pursestring stitch as well as with another figure-of-eight 0 Vicryl stitch. The liver bed was inspected again and found to be hemostatic. The abdomen was irrigated with copious amounts of saline until the effluent was clear. The ports were then removed under direct vision without difficulty and were found to be hemostatic. The gas was allowed to escape. The skin incisions were all closed with interrupted 4-0 Monocryl subcuticular stitches. Dermabond dressings were applied. The patient tolerated the procedure well. At the end of the case all needle sponge and instrument counts were correct. The patient was then awakened and taken to recovery in stable condition  PLAN OF CARE: Discharge to home after PACU  PATIENT DISPOSITION:  PACU - hemodynamically stable.   Delay start of Pharmacological VTE agent (>24hrs) due to surgical blood loss or risk of bleeding: not applicable

## 2013-09-07 NOTE — Preoperative (Signed)
Beta Blockers   Reason not to administer Beta Blockers:Not Applicable 

## 2013-09-07 NOTE — Anesthesia Procedure Notes (Signed)
Performed by: Mikaya Bunner       

## 2013-09-07 NOTE — Transfer of Care (Signed)
Immediate Anesthesia Transfer of Care Note  Patient: Dawn Riggs  Procedure(s) Performed: Procedure(s): LAPAROSCOPIC CHOLECYSTECTOMY WITH INTRAOPERATIVE CHOLANGIOGRAM (N/A)  Patient Location: PACU  Anesthesia Type:General  Level of Consciousness: awake, alert  and oriented  Airway & Oxygen Therapy: Patient Spontanous Breathing and Patient connected to nasal cannula oxygen  Post-op Assessment: Report given to PACU RN and Post -op Vital signs reviewed and stable  Post vital signs: Reviewed and stable  Complications: No apparent anesthesia complications

## 2013-09-07 NOTE — Anesthesia Preprocedure Evaluation (Addendum)
Anesthesia Evaluation  Patient identified by MRN, date of birth, ID band Patient awake    Reviewed: Allergy & Precautions, H&P , NPO status , Patient's Chart, lab work & pertinent test results, reviewed documented beta blocker date and time   History of Anesthesia Complications (+) PONV  Airway Mallampati: I TM Distance: >3 FB Neck ROM: Full    Dental  (+) Teeth Intact   Pulmonary    Pulmonary exam normal       Cardiovascular Rhythm:Regular     Neuro/Psych    GI/Hepatic   Endo/Other  Hypothyroidism   Renal/GU      Musculoskeletal   Abdominal   Peds  Hematology   Anesthesia Other Findings   Reproductive/Obstetrics                          Anesthesia Physical Anesthesia Plan  ASA: II  Anesthesia Plan: General   Post-op Pain Management:    Induction: Intravenous  Airway Management Planned: Oral ETT  Additional Equipment:   Intra-op Plan:   Post-operative Plan: Extubation in OR  Informed Consent: I have reviewed the patients History and Physical, chart, labs and discussed the procedure including the risks, benefits and alternatives for the proposed anesthesia with the patient or authorized representative who has indicated his/her understanding and acceptance.     Plan Discussed with: CRNA and Surgeon  Anesthesia Plan Comments:         Anesthesia Quick Evaluation

## 2013-09-08 NOTE — Anesthesia Postprocedure Evaluation (Signed)
Anesthesia Post Note  Patient: Dawn Riggs  Procedure(s) Performed: Procedure(s) (LRB): LAPAROSCOPIC CHOLECYSTECTOMY WITH INTRAOPERATIVE CHOLANGIOGRAM (N/A)  Anesthesia type: general  Patient location: PACU  Post pain: Pain level controlled  Post assessment: Patient's Cardiovascular Status Stable  Last Vitals:  Filed Vitals:   09/07/13 1600  BP: 133/62  Pulse: 62  Temp: 36.7 C  Resp: 16    Post vital signs: Reviewed and stable  Level of consciousness: sedated  Complications: No apparent anesthesia complications

## 2013-09-11 ENCOUNTER — Encounter (HOSPITAL_COMMUNITY): Payer: Self-pay | Admitting: General Surgery

## 2013-09-11 DIAGNOSIS — Z23 Encounter for immunization: Secondary | ICD-10-CM | POA: Diagnosis not present

## 2013-09-11 DIAGNOSIS — E559 Vitamin D deficiency, unspecified: Secondary | ICD-10-CM | POA: Diagnosis not present

## 2013-09-11 DIAGNOSIS — E039 Hypothyroidism, unspecified: Secondary | ICD-10-CM | POA: Diagnosis not present

## 2013-09-11 DIAGNOSIS — Z131 Encounter for screening for diabetes mellitus: Secondary | ICD-10-CM | POA: Diagnosis not present

## 2013-09-11 DIAGNOSIS — E7800 Pure hypercholesterolemia, unspecified: Secondary | ICD-10-CM | POA: Diagnosis not present

## 2013-09-11 DIAGNOSIS — Z1331 Encounter for screening for depression: Secondary | ICD-10-CM | POA: Diagnosis not present

## 2013-09-26 ENCOUNTER — Ambulatory Visit (INDEPENDENT_AMBULATORY_CARE_PROVIDER_SITE_OTHER): Payer: Medicare Other | Admitting: General Surgery

## 2013-09-26 ENCOUNTER — Encounter (INDEPENDENT_AMBULATORY_CARE_PROVIDER_SITE_OTHER): Payer: Self-pay | Admitting: General Surgery

## 2013-09-26 VITALS — BP 120/70 | HR 62 | Temp 97.4°F | Resp 14 | Ht 66.0 in | Wt 172.0 lb

## 2013-09-26 DIAGNOSIS — K8020 Calculus of gallbladder without cholecystitis without obstruction: Secondary | ICD-10-CM

## 2013-09-26 NOTE — Patient Instructions (Signed)
May return to all normal activities 

## 2013-09-27 ENCOUNTER — Encounter (INDEPENDENT_AMBULATORY_CARE_PROVIDER_SITE_OTHER): Payer: Self-pay | Admitting: General Surgery

## 2013-09-27 NOTE — Progress Notes (Signed)
Subjective:     Patient ID: Dawn Riggs, female   DOB: [birth year], 72 y.o.   MRN: [id number]  HPI The patient is a 72 year old white female who is 2-1/2 weeks status post laparoscopic cholecystectomy for cholecystitis with cholelithiasis. She is feeling well and has no complaints today. Her appetite is good and her bowels are moving regularly. She only occasionally has a little bit of diarrhea.  Review of Systems     Objective:   Physical Exam On exam her abdomen is soft and nontender. Her incisions are all healing nicely with no sign of infection.    Assessment:     The patient is to half weeks status post laparoscopic cholecystectomy     Plan:     At this point she is doing very well. I believe she can return to her normal activities in the next couple weeks. I will plan to see her back on a when necessary basis.

## 2013-10-16 DIAGNOSIS — E039 Hypothyroidism, unspecified: Secondary | ICD-10-CM | POA: Diagnosis not present

## 2013-10-16 DIAGNOSIS — H00019 Hordeolum externum unspecified eye, unspecified eyelid: Secondary | ICD-10-CM | POA: Diagnosis not present

## 2013-10-23 DIAGNOSIS — M899 Disorder of bone, unspecified: Secondary | ICD-10-CM | POA: Diagnosis not present

## 2013-10-23 DIAGNOSIS — Z9189 Other specified personal risk factors, not elsewhere classified: Secondary | ICD-10-CM | POA: Diagnosis not present

## 2013-10-23 DIAGNOSIS — C50919 Malignant neoplasm of unspecified site of unspecified female breast: Secondary | ICD-10-CM | POA: Diagnosis not present

## 2013-10-23 DIAGNOSIS — Z124 Encounter for screening for malignant neoplasm of cervix: Secondary | ICD-10-CM | POA: Diagnosis not present

## 2014-03-26 DIAGNOSIS — M76829 Posterior tibial tendinitis, unspecified leg: Secondary | ICD-10-CM | POA: Diagnosis not present

## 2014-03-30 DIAGNOSIS — M76829 Posterior tibial tendinitis, unspecified leg: Secondary | ICD-10-CM | POA: Diagnosis not present

## 2014-04-03 DIAGNOSIS — M76829 Posterior tibial tendinitis, unspecified leg: Secondary | ICD-10-CM | POA: Diagnosis not present

## 2014-04-05 DIAGNOSIS — M76829 Posterior tibial tendinitis, unspecified leg: Secondary | ICD-10-CM | POA: Diagnosis not present

## 2014-04-10 DIAGNOSIS — M76829 Posterior tibial tendinitis, unspecified leg: Secondary | ICD-10-CM | POA: Diagnosis not present

## 2014-04-12 DIAGNOSIS — M76829 Posterior tibial tendinitis, unspecified leg: Secondary | ICD-10-CM | POA: Diagnosis not present

## 2014-04-17 DIAGNOSIS — M76829 Posterior tibial tendinitis, unspecified leg: Secondary | ICD-10-CM | POA: Diagnosis not present

## 2014-04-19 DIAGNOSIS — H00019 Hordeolum externum unspecified eye, unspecified eyelid: Secondary | ICD-10-CM | POA: Diagnosis not present

## 2014-04-19 DIAGNOSIS — M76829 Posterior tibial tendinitis, unspecified leg: Secondary | ICD-10-CM | POA: Diagnosis not present

## 2014-04-19 DIAGNOSIS — E039 Hypothyroidism, unspecified: Secondary | ICD-10-CM | POA: Diagnosis not present

## 2014-05-28 DIAGNOSIS — M722 Plantar fascial fibromatosis: Secondary | ICD-10-CM | POA: Diagnosis not present

## 2014-07-30 ENCOUNTER — Other Ambulatory Visit (HOSPITAL_COMMUNITY): Payer: Self-pay | Admitting: Obstetrics and Gynecology

## 2014-07-30 DIAGNOSIS — Z1231 Encounter for screening mammogram for malignant neoplasm of breast: Secondary | ICD-10-CM

## 2014-08-29 ENCOUNTER — Ambulatory Visit (HOSPITAL_COMMUNITY)
Admission: RE | Admit: 2014-08-29 | Discharge: 2014-08-29 | Disposition: A | Discharge status: Home / Self Care | Payer: Medicare Other | Source: Ambulatory Visit | Attending: Obstetrics and Gynecology | Admitting: Obstetrics and Gynecology

## 2014-08-29 ENCOUNTER — Other Ambulatory Visit (HOSPITAL_COMMUNITY): Payer: Self-pay | Admitting: Obstetrics and Gynecology

## 2014-08-29 DIAGNOSIS — Z1231 Encounter for screening mammogram for malignant neoplasm of breast: Secondary | ICD-10-CM

## 2014-10-22 ENCOUNTER — Encounter (INDEPENDENT_AMBULATORY_CARE_PROVIDER_SITE_OTHER): Payer: Self-pay | Admitting: General Surgery

## 2014-10-22 DIAGNOSIS — E039 Hypothyroidism, unspecified: Secondary | ICD-10-CM | POA: Diagnosis not present

## 2014-10-22 DIAGNOSIS — E559 Vitamin D deficiency, unspecified: Secondary | ICD-10-CM | POA: Diagnosis not present

## 2014-10-24 DIAGNOSIS — M899 Disorder of bone, unspecified: Secondary | ICD-10-CM | POA: Diagnosis not present

## 2014-10-24 DIAGNOSIS — Z124 Encounter for screening for malignant neoplasm of cervix: Secondary | ICD-10-CM | POA: Diagnosis not present

## 2014-10-24 DIAGNOSIS — Z853 Personal history of malignant neoplasm of breast: Secondary | ICD-10-CM | POA: Diagnosis not present

## 2014-10-25 DIAGNOSIS — E039 Hypothyroidism, unspecified: Secondary | ICD-10-CM | POA: Diagnosis not present

## 2014-10-25 DIAGNOSIS — M722 Plantar fascial fibromatosis: Secondary | ICD-10-CM | POA: Diagnosis not present

## 2014-10-25 DIAGNOSIS — H0011 Chalazion right upper eyelid: Secondary | ICD-10-CM | POA: Diagnosis not present

## 2014-10-25 DIAGNOSIS — E559 Vitamin D deficiency, unspecified: Secondary | ICD-10-CM | POA: Diagnosis not present

## 2014-10-25 DIAGNOSIS — Z23 Encounter for immunization: Secondary | ICD-10-CM | POA: Diagnosis not present

## 2014-11-09 DIAGNOSIS — H0011 Chalazion right upper eyelid: Secondary | ICD-10-CM | POA: Diagnosis not present

## 2014-11-09 DIAGNOSIS — H2513 Age-related nuclear cataract, bilateral: Secondary | ICD-10-CM | POA: Diagnosis not present

## 2014-11-26 DIAGNOSIS — Z09 Encounter for follow-up examination after completed treatment for conditions other than malignant neoplasm: Secondary | ICD-10-CM | POA: Diagnosis not present

## 2014-11-26 DIAGNOSIS — Z86010 Personal history of colonic polyps: Secondary | ICD-10-CM | POA: Diagnosis not present

## 2014-11-26 DIAGNOSIS — D179 Benign lipomatous neoplasm, unspecified: Secondary | ICD-10-CM | POA: Diagnosis not present

## 2014-11-26 DIAGNOSIS — D175 Benign lipomatous neoplasm of intra-abdominal organs: Secondary | ICD-10-CM | POA: Diagnosis not present

## 2014-11-26 DIAGNOSIS — K641 Second degree hemorrhoids: Secondary | ICD-10-CM | POA: Diagnosis not present

## 2014-11-26 DIAGNOSIS — Z1211 Encounter for screening for malignant neoplasm of colon: Secondary | ICD-10-CM | POA: Diagnosis not present

## 2014-12-11 IMAGING — US US ABDOMEN COMPLETE
1 series · 14 of 25 positions shown · non-contrast
Comparison: None.

` *RADIOLOGY REPORT*
CLINICAL DATA: Right upper abdominal pain.

COMPLETE ABDOMINAL ULTRASOUND

[Series 1: us abdomen complete · 0.23mm/px · 14 of 76 slices shown]
[im 1/76]
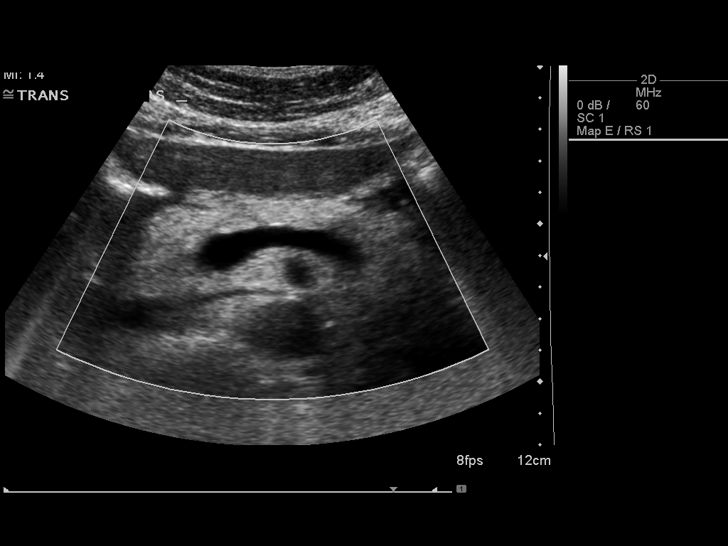
[im 7/76]
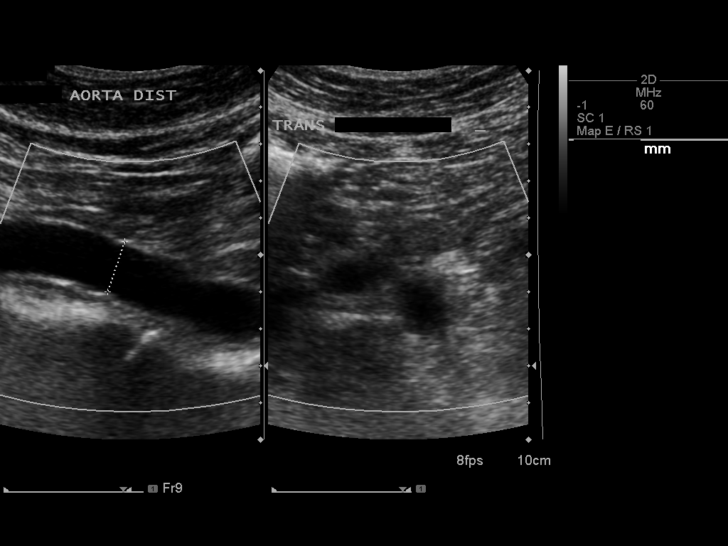
[im 13/76]
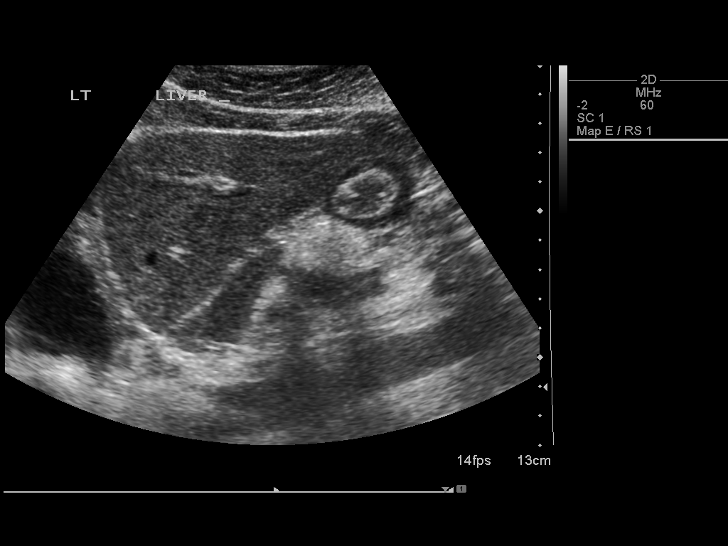
[im 19/76]
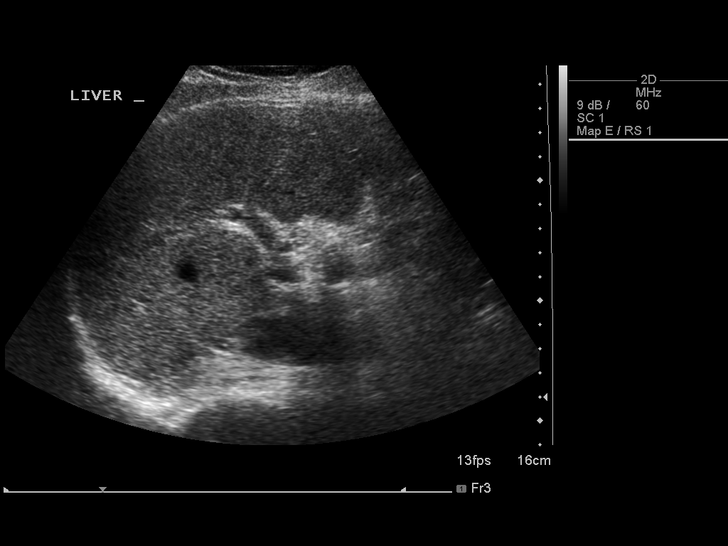
[im 26/76]
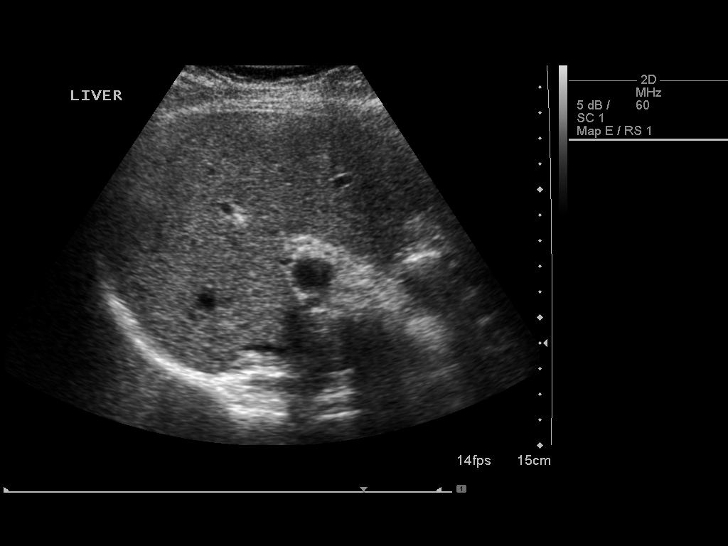
[im 29/76]
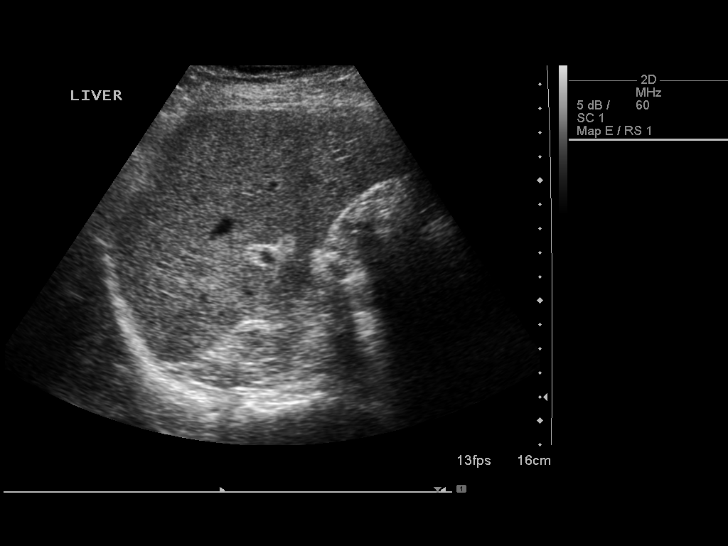
[im 35/76]
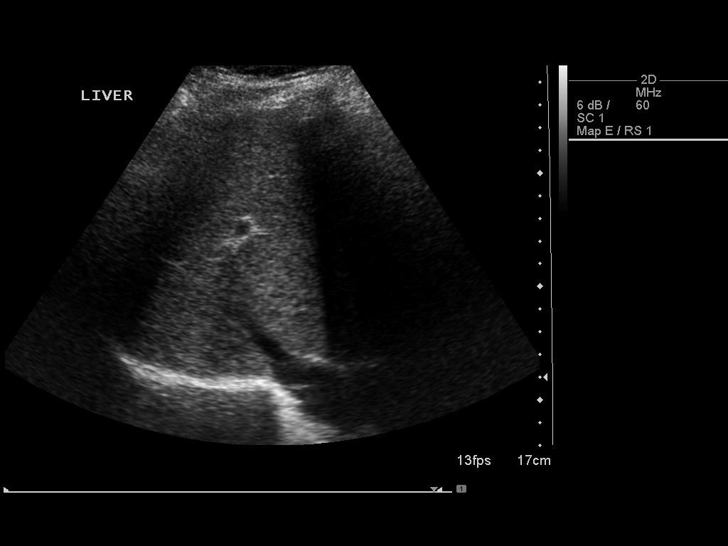
[im 41/76]
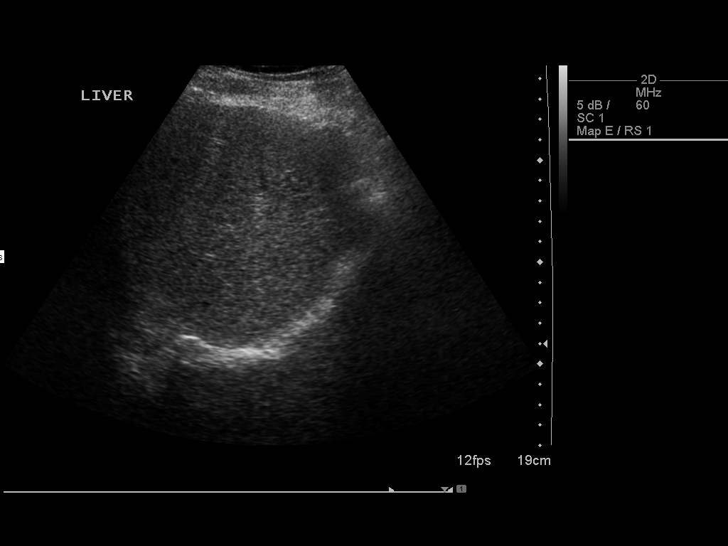
[im 47/76]
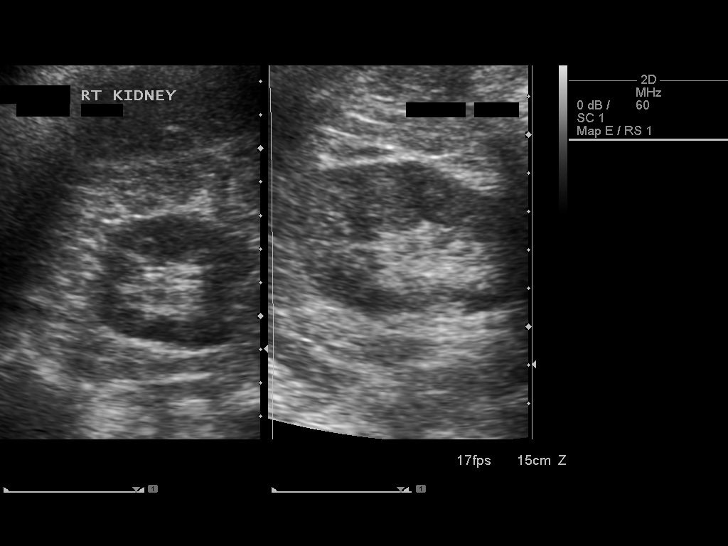
[im 51/76]
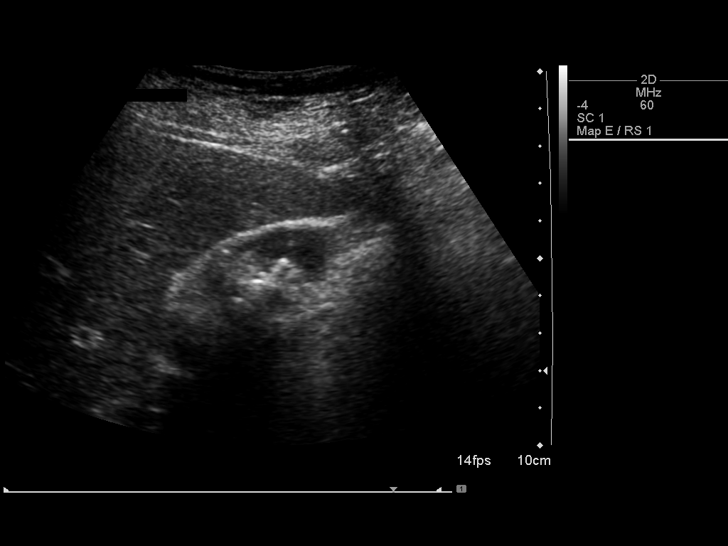
[im 57/76]
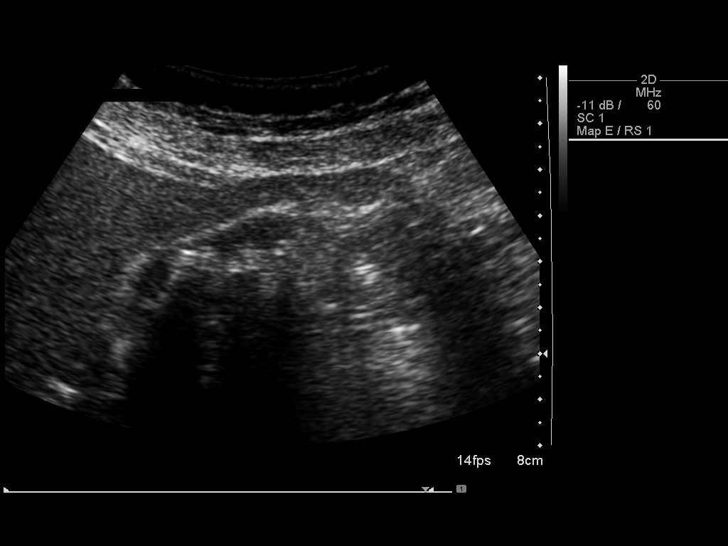
[im 63/76]
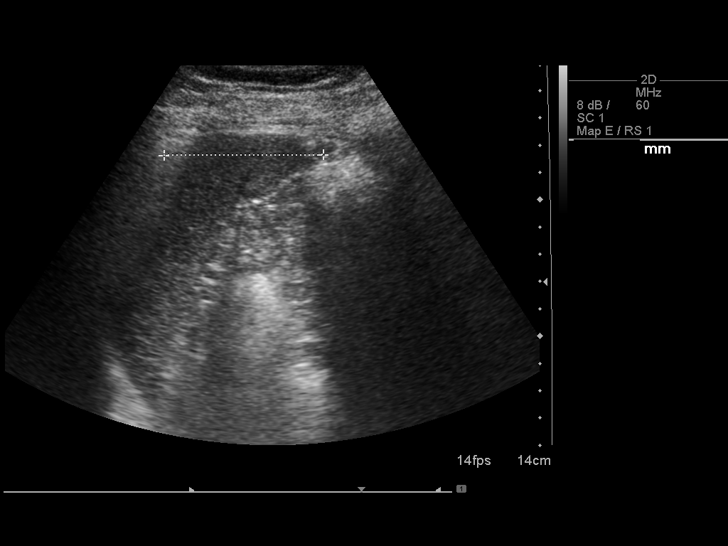
[im 69/76]
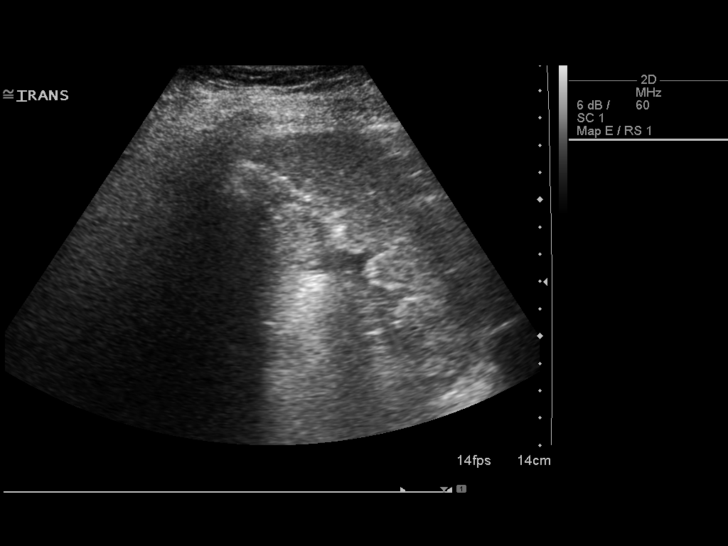
[im 76/76]
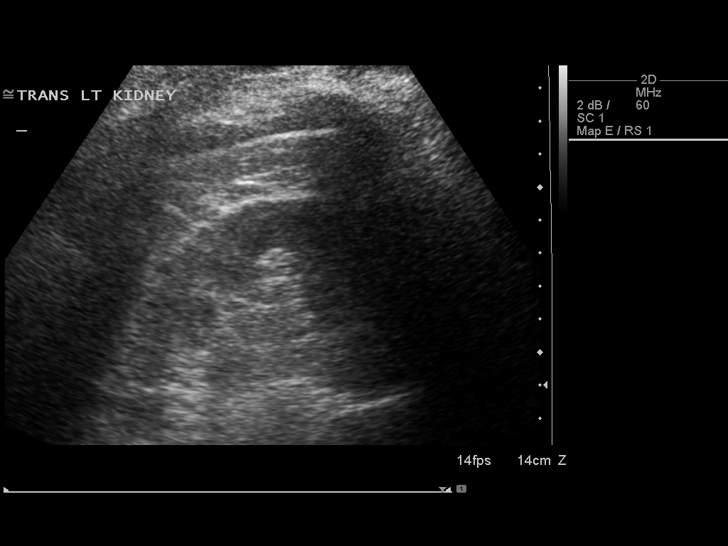

[14 of 25 positions shown; findings below may reference images not displayed]

FINDINGS: Gallbladder:  Innumerable sub centimeter stones throughout the
lumen of the gallbladder.  No gallbladder wall thickening or
pericholecystic fluid.  Sonographer reports no sonographic Murphy's
sign.

Common bile duct:  3.2 mm diameter, unremarkable.

Liver:  No focal lesion identified.  Within normal limits in
parenchymal echogenicity.

IVC:  Appears normal.

Pancreas:  No focal abnormality seen.

Spleen:  5.8 cm craniocaudal length, unremarkable.

Right Kidney:  11.6 cm. No hydronephrosis.  Well-preserved cortex.
Normal size and parenchymal echotexture without focal
abnormalities.

Left Kidney:  11.3 cm. No hydronephrosis.  Well-preserved cortex.
Normal size and parenchymal echotexture without focal
abnormalities.

Abdominal aorta:  Normal in caliber, no evidence of aneurysm.
IMPRESSION: 1.  Cholelithiasis without other ultrasound evidence of
cholecystitis or biliary obstruction.

## 2015-01-25 DIAGNOSIS — H2511 Age-related nuclear cataract, right eye: Secondary | ICD-10-CM | POA: Diagnosis not present

## 2015-01-25 DIAGNOSIS — H18412 Arcus senilis, left eye: Secondary | ICD-10-CM | POA: Diagnosis not present

## 2015-01-25 DIAGNOSIS — H02839 Dermatochalasis of unspecified eye, unspecified eyelid: Secondary | ICD-10-CM | POA: Diagnosis not present

## 2015-01-25 DIAGNOSIS — H18411 Arcus senilis, right eye: Secondary | ICD-10-CM | POA: Diagnosis not present

## 2015-03-11 DIAGNOSIS — H52201 Unspecified astigmatism, right eye: Secondary | ICD-10-CM | POA: Diagnosis not present

## 2015-03-11 DIAGNOSIS — H2513 Age-related nuclear cataract, bilateral: Secondary | ICD-10-CM | POA: Diagnosis not present

## 2015-03-11 DIAGNOSIS — H269 Unspecified cataract: Secondary | ICD-10-CM | POA: Diagnosis not present

## 2015-03-11 DIAGNOSIS — H2511 Age-related nuclear cataract, right eye: Secondary | ICD-10-CM | POA: Diagnosis not present

## 2015-03-12 DIAGNOSIS — H2512 Age-related nuclear cataract, left eye: Secondary | ICD-10-CM | POA: Diagnosis not present

## 2015-03-25 DIAGNOSIS — H2512 Age-related nuclear cataract, left eye: Secondary | ICD-10-CM | POA: Diagnosis not present

## 2015-03-25 DIAGNOSIS — H2513 Age-related nuclear cataract, bilateral: Secondary | ICD-10-CM | POA: Diagnosis not present

## 2015-03-25 DIAGNOSIS — H269 Unspecified cataract: Secondary | ICD-10-CM | POA: Diagnosis not present

## 2015-04-22 DIAGNOSIS — H00013 Hordeolum externum right eye, unspecified eyelid: Secondary | ICD-10-CM | POA: Diagnosis not present

## 2015-04-22 DIAGNOSIS — H11151 Pinguecula, right eye: Secondary | ICD-10-CM | POA: Diagnosis not present

## 2015-10-03 DIAGNOSIS — Z23 Encounter for immunization: Secondary | ICD-10-CM | POA: Diagnosis not present

## 2015-10-30 ENCOUNTER — Other Ambulatory Visit: Payer: Self-pay | Admitting: Obstetrics and Gynecology

## 2015-10-30 DIAGNOSIS — Z6829 Body mass index (BMI) 29.0-29.9, adult: Secondary | ICD-10-CM | POA: Diagnosis not present

## 2015-10-30 DIAGNOSIS — M8580 Other specified disorders of bone density and structure, unspecified site: Secondary | ICD-10-CM | POA: Diagnosis not present

## 2015-10-30 DIAGNOSIS — N904 Leukoplakia of vulva: Secondary | ICD-10-CM | POA: Diagnosis not present

## 2015-10-30 DIAGNOSIS — Z1231 Encounter for screening mammogram for malignant neoplasm of breast: Secondary | ICD-10-CM | POA: Diagnosis not present

## 2015-10-30 DIAGNOSIS — Z01419 Encounter for gynecological examination (general) (routine) without abnormal findings: Secondary | ICD-10-CM | POA: Diagnosis not present

## 2015-11-05 ENCOUNTER — Other Ambulatory Visit: Payer: Self-pay | Admitting: Obstetrics and Gynecology

## 2015-11-05 DIAGNOSIS — N6489 Other specified disorders of breast: Secondary | ICD-10-CM

## 2015-11-11 DIAGNOSIS — H0011 Chalazion right upper eyelid: Secondary | ICD-10-CM | POA: Diagnosis not present

## 2015-11-11 DIAGNOSIS — Z23 Encounter for immunization: Secondary | ICD-10-CM | POA: Diagnosis not present

## 2015-11-11 DIAGNOSIS — E559 Vitamin D deficiency, unspecified: Secondary | ICD-10-CM | POA: Diagnosis not present

## 2015-11-11 DIAGNOSIS — M722 Plantar fascial fibromatosis: Secondary | ICD-10-CM | POA: Diagnosis not present

## 2015-11-11 DIAGNOSIS — E039 Hypothyroidism, unspecified: Secondary | ICD-10-CM | POA: Diagnosis not present

## 2015-11-12 ENCOUNTER — Other Ambulatory Visit: Payer: Self-pay | Admitting: Obstetrics and Gynecology

## 2015-11-12 ENCOUNTER — Ambulatory Visit
Admission: RE | Admit: 2015-11-12 | Discharge: 2015-11-12 | Disposition: A | Discharge status: Home / Self Care | Payer: Medicare Other | Source: Ambulatory Visit | Attending: Obstetrics and Gynecology | Admitting: Obstetrics and Gynecology

## 2015-11-12 DIAGNOSIS — N6489 Other specified disorders of breast: Secondary | ICD-10-CM

## 2015-11-12 DIAGNOSIS — N63 Unspecified lump in breast: Secondary | ICD-10-CM | POA: Diagnosis not present

## 2015-11-20 ENCOUNTER — Ambulatory Visit
Admission: RE | Admit: 2015-11-20 | Discharge: 2015-11-20 | Disposition: A | Discharge status: Home / Self Care | Payer: Medicare Other | Source: Ambulatory Visit | Attending: Obstetrics and Gynecology | Admitting: Obstetrics and Gynecology

## 2015-11-20 ENCOUNTER — Other Ambulatory Visit: Payer: Self-pay | Admitting: Obstetrics and Gynecology

## 2015-11-20 DIAGNOSIS — Z85850 Personal history of malignant neoplasm of thyroid: Secondary | ICD-10-CM | POA: Diagnosis not present

## 2015-11-20 DIAGNOSIS — E559 Vitamin D deficiency, unspecified: Secondary | ICD-10-CM | POA: Diagnosis not present

## 2015-11-20 DIAGNOSIS — D0591 Unspecified type of carcinoma in situ of right breast: Secondary | ICD-10-CM | POA: Diagnosis not present

## 2015-11-20 DIAGNOSIS — N6489 Other specified disorders of breast: Secondary | ICD-10-CM

## 2015-11-20 DIAGNOSIS — N63 Unspecified lump in breast: Secondary | ICD-10-CM | POA: Diagnosis not present

## 2015-11-20 DIAGNOSIS — E039 Hypothyroidism, unspecified: Secondary | ICD-10-CM | POA: Diagnosis not present

## 2015-11-20 DIAGNOSIS — C50411 Malignant neoplasm of upper-outer quadrant of right female breast: Secondary | ICD-10-CM | POA: Diagnosis not present

## 2015-11-20 DIAGNOSIS — Z1389 Encounter for screening for other disorder: Secondary | ICD-10-CM | POA: Diagnosis not present

## 2015-11-20 DIAGNOSIS — Z86010 Personal history of colonic polyps: Secondary | ICD-10-CM | POA: Diagnosis not present

## 2015-11-20 DIAGNOSIS — Z23 Encounter for immunization: Secondary | ICD-10-CM | POA: Diagnosis not present

## 2015-11-20 DIAGNOSIS — Z853 Personal history of malignant neoplasm of breast: Secondary | ICD-10-CM | POA: Diagnosis not present

## 2015-11-27 ENCOUNTER — Other Ambulatory Visit: Payer: Self-pay | Admitting: General Surgery

## 2015-11-27 DIAGNOSIS — C50411 Malignant neoplasm of upper-outer quadrant of right female breast: Secondary | ICD-10-CM | POA: Diagnosis not present

## 2015-12-03 ENCOUNTER — Other Ambulatory Visit: Payer: Self-pay | Admitting: General Surgery

## 2015-12-03 DIAGNOSIS — C73 Malignant neoplasm of thyroid gland: Secondary | ICD-10-CM

## 2015-12-03 DIAGNOSIS — R221 Localized swelling, mass and lump, neck: Secondary | ICD-10-CM

## 2015-12-04 ENCOUNTER — Ambulatory Visit
Admission: RE | Admit: 2015-12-04 | Discharge: 2015-12-04 | Disposition: A | Discharge status: Home / Self Care | Payer: Medicare Other | Source: Ambulatory Visit | Attending: General Surgery | Admitting: General Surgery

## 2015-12-04 DIAGNOSIS — R221 Localized swelling, mass and lump, neck: Secondary | ICD-10-CM

## 2015-12-04 DIAGNOSIS — C73 Malignant neoplasm of thyroid gland: Secondary | ICD-10-CM

## 2015-12-04 DIAGNOSIS — E041 Nontoxic single thyroid nodule: Secondary | ICD-10-CM | POA: Diagnosis not present

## 2015-12-05 ENCOUNTER — Other Ambulatory Visit: Payer: Self-pay | Admitting: General Surgery

## 2015-12-05 DIAGNOSIS — E041 Nontoxic single thyroid nodule: Secondary | ICD-10-CM

## 2015-12-09 NOTE — Pre-Procedure Instructions (Addendum)
    Dawn Riggs  12/09/2015      [id number] [address] [place] [address] Phone: [phone] Fax: [phone]    Your procedure is scheduled on Thursday December 12 2015 at 730am.  Report to [facility] at 530 A.M.  Call this number if you have problems in the days leading up to your surgery:  [phone]  Call this number if you have problems the morning of surgery:  [phone]   Remember:  Do not eat food or drink liquids after midnight Wednesday December 21st.  Take these medicines the morning of surgery with A SIP OF WATER levothyroxine (unithroid)   STOP: ALL Vitamins (stop your multivitamin), Supplements, Effient and Herbal Medications,  Fish Oils, Aspirins, NSAIDs  (Nonsteroidal Anti-inflammatories such as Ibuprofen, Aleve, or  Advil), and Goody's/BC  Powders from now until after surgery as directed by your physician.     Do not wear jewelry, make-up or nail polish.  Do not wear lotions, powders, or perfumes.  You may wear deodorant.  Do not shave 48 hours prior to surgery.  Men may shave face and neck.  Do not bring valuables to the hospital.  [facility] is not responsible for any belongings or valuables.  Contacts, dentures or bridgework may not be worn into surgery.  Leave your suitcase in the car.  After surgery it may be brought to your room.  For patients admitted to the hospital, discharge time will be determined by your treatment team.  Patients discharged the day of surgery will not be allowed to drive home.   Please read over the following fact sheets that you were given. Pain Booklet, Coughing and Deep Breathing and Surgical Site Infection Prevention   Please follow these instructions carefully:  1. Shower with CHG Soap the night before surgery and the morning of Surgery. 2. If you choose to wash your hair, wash your hair first as usual with your  normal shampoo. 3. After you shampoo, rinse your hair and body thoroughly to remove the Shampoo. 4. Use CHG as you would any other liquid soap. You can apply chg directly to the skin and wash gently with scrungie or a clean washcloth. 5. Apply the CHG Soap to your body ONLY FROM THE NECK DOWN. Do not use on open wounds or open sores. Avoid contact with your eyes, ears, mouth and genitals (private parts). Wash genitals (private parts) with your normal soap. 6. Wash thoroughly, paying special attention to the area where your surgery will be performed. 7. Thoroughly rinse your body with warm water from the neck down. 8. DO NOT shower/wash with your normal soap after using and rinsing off the CHG Soap. 9. Pat yourself dry with a clean towel.  10. Wear clean pajamas.  11. Place clean sheets on your bed the night of your first shower and do not sleep with pets.  Day of Surgery  Do not apply any lotions/deodorants the morning of surgery. Please wear clean clothes to the hospital/surgery center.

## 2015-12-10 ENCOUNTER — Encounter (HOSPITAL_COMMUNITY): Payer: Self-pay

## 2015-12-10 ENCOUNTER — Encounter (HOSPITAL_COMMUNITY)
Admission: RE | Admit: 2015-12-10 | Discharge: 2015-12-10 | Disposition: A | Discharge status: Home / Self Care | Payer: Medicare Other | Source: Ambulatory Visit | Attending: General Surgery | Admitting: General Surgery

## 2015-12-10 DIAGNOSIS — Z79899 Other long term (current) drug therapy: Secondary | ICD-10-CM | POA: Diagnosis not present

## 2015-12-10 DIAGNOSIS — Z85850 Personal history of malignant neoplasm of thyroid: Secondary | ICD-10-CM | POA: Diagnosis not present

## 2015-12-10 DIAGNOSIS — Z9012 Acquired absence of left breast and nipple: Secondary | ICD-10-CM | POA: Diagnosis not present

## 2015-12-10 DIAGNOSIS — G43909 Migraine, unspecified, not intractable, without status migrainosus: Secondary | ICD-10-CM | POA: Diagnosis not present

## 2015-12-10 DIAGNOSIS — Z85820 Personal history of malignant melanoma of skin: Secondary | ICD-10-CM | POA: Diagnosis not present

## 2015-12-10 DIAGNOSIS — C50411 Malignant neoplasm of upper-outer quadrant of right female breast: Secondary | ICD-10-CM | POA: Diagnosis not present

## 2015-12-10 DIAGNOSIS — Z170 Estrogen receptor positive status [ER+]: Secondary | ICD-10-CM | POA: Diagnosis not present

## 2015-12-10 HISTORY — DX: Headache: R51

## 2015-12-10 HISTORY — DX: Headache, unspecified: R51.9

## 2015-12-10 LAB — CBC
HCT: 44.7 % (ref 36.0–46.0)
Hemoglobin: 14.9 g/dL (ref 12.0–15.0)
MCH: 29 pg (ref 26.0–34.0)
MCHC: 33.3 g/dL (ref 30.0–36.0)
MCV: 87 fL (ref 78.0–100.0)
PLATELETS: 248 10*3/uL (ref 150–400)
RBC: 5.14 MIL/uL — ABNORMAL HIGH (ref 3.87–5.11)
RDW: 13.5 % (ref 11.5–15.5)
WBC: 6 10*3/uL (ref 4.0–10.5)

## 2015-12-10 LAB — BASIC METABOLIC PANEL
Anion gap: 5 (ref 5–15)
BUN: 11 mg/dL (ref 6–20)
CALCIUM: 9.3 mg/dL (ref 8.9–10.3)
CHLORIDE: 104 mmol/L (ref 101–111)
CO2: 29 mmol/L (ref 22–32)
CREATININE: 0.71 mg/dL (ref 0.44–1.00)
Glucose, Bld: 93 mg/dL (ref 65–99)
Potassium: 4.2 mmol/L (ref 3.5–5.1)
SODIUM: 138 mmol/L (ref 135–145)

## 2015-12-10 NOTE — Progress Notes (Signed)
Pt. Reports that she had her last EKG (most likely ) in [place]. [place], 1983.  Pt. Reports that she walks up to 5 miles per day, she hasn't ever had any chest pain, changes in breathing, SOB, difficulty breathing. Pt. Is followed by Dr. Leonides Schanz, W . for PCP.

## 2015-12-10 NOTE — Pre-Procedure Instructions (Signed)
Dawn Riggs  12/10/2015      [id number] [name], [name] - [address] Dawn Riggs [place] [address] Phone: [phone] Fax: [phone]    Your procedure is scheduled on 12/12/2015.  Report to [facility] Admitting at 5:30 A.M.  Call this number if you have problems the morning of surgery:  [phone]   Remember:  Do not eat food or drink liquids after midnight.  On Wednesday  Take these medicines the morning of surgery with A SIP OF WATER : thyroid medicine    Do not wear jewelry, make-up or nail polish.   Do not wear lotions, powders, or perfumes.     Do not shave 48 hours prior to surgery.    Do not bring valuables to the hospital.   [facility] is not responsible for any belongings or valuables.  Contacts, dentures or bridgework may not be worn into surgery.  Leave your suitcase in the car.  After surgery it may be brought to your room.  For patients admitted to the hospital, discharge time will be determined by your treatment team.  Patients discharged the day of surgery will not be allowed to drive home.   Name and phone number of your driver:   /with family Special instructions:  Special Instructions: [name] - Preparing for Surgery  Before surgery, you can play an important role.  Because skin is not sterile, your skin needs to be as free of germs as possible.  You can reduce the number of germs on you skin by washing with CHG (chlorahexidine gluconate) soap before surgery.  CHG is an antiseptic cleaner which kills germs and bonds with the skin to continue killing germs even after washing.  Please DO NOT use if you have an allergy to CHG or antibacterial soaps.  If your skin becomes reddened/irritated stop using the CHG and inform your nurse when you arrive at Short Stay.  Do not shave (including legs and underarms) for at least 48 hours prior to the first CHG shower.  You may shave your face.  Please follow these  instructions carefully:   1.  Shower with CHG Soap the night before surgery and the  morning of Surgery.  2.  If you choose to wash your hair, wash your hair first as usual with your  normal shampoo.  3.  After you shampoo, rinse your hair and body thoroughly to remove the  Shampoo.  4.  Use CHG as you would any other liquid soap.  You can apply chg directly to the skin and wash gently with scrungie or a clean washcloth.  5.  Apply the CHG Soap to your body ONLY FROM THE NECK DOWN.    Do not use on open wounds or open sores.  Avoid contact with your eyes, ears, mouth and genitals (private parts).  Wash genitals (private parts)   with your normal soap.  6.  Wash thoroughly, paying special attention to the area where your surgery will be performed.  7.  Thoroughly rinse your body with warm water from the neck down.  8.  DO NOT shower/wash with your normal soap after using and rinsing off   the CHG Soap.  9.  Pat yourself dry with a clean towel.            10.  Wear clean pajamas.            11.  Place clean sheets on your bed the  night of your first shower and do not sleep with pets.  Day of Surgery  Do not apply any lotions/deodorants the morning of surgery.  Please wear clean clothes to the hospital/surgery center.  Please read over the following fact sheets that you were given. Pain Booklet, Coughing and Deep Breathing and Surgical Site Infection Prevention

## 2015-12-11 MED ORDER — CHLORHEXIDINE GLUCONATE 4 % EX LIQD
1.0000 "application " | Freq: Once | CUTANEOUS | Status: DC
  Administered 2015-12-12: 1 via TOPICAL

## 2015-12-11 MED ORDER — CEFAZOLIN SODIUM-DEXTROSE 2-3 GM-% IV SOLR
2.0000 g | INTRAVENOUS | Status: AC
  Administered 2015-12-12: 2 g via INTRAVENOUS
  Filled 2015-12-11: qty 50

## 2015-12-12 ENCOUNTER — Ambulatory Visit (HOSPITAL_COMMUNITY): Payer: Medicare Other | Admitting: Certified Registered Nurse Anesthetist

## 2015-12-12 ENCOUNTER — Ambulatory Visit (HOSPITAL_COMMUNITY)
Admission: RE | Admit: 2015-12-12 | Discharge: 2015-12-12 | Disposition: A | Discharge status: Home / Self Care | Payer: Medicare Other | Source: Ambulatory Visit | Attending: General Surgery | Admitting: General Surgery

## 2015-12-12 ENCOUNTER — Encounter (HOSPITAL_COMMUNITY)
Admission: RE | Disposition: A | Discharge status: Home / Self Care | Payer: Self-pay | Source: Ambulatory Visit | Attending: General Surgery

## 2015-12-12 ENCOUNTER — Ambulatory Visit (HOSPITAL_COMMUNITY)
Admission: RE | Admit: 2015-12-12 | Discharge: 2015-12-13 | Disposition: A | Discharge status: Home / Self Care | Payer: Medicare Other | Source: Ambulatory Visit | Attending: General Surgery | Admitting: General Surgery

## 2015-12-12 ENCOUNTER — Encounter (HOSPITAL_COMMUNITY): Payer: Self-pay | Admitting: *Deleted

## 2015-12-12 DIAGNOSIS — G43909 Migraine, unspecified, not intractable, without status migrainosus: Secondary | ICD-10-CM | POA: Insufficient documentation

## 2015-12-12 DIAGNOSIS — Z85820 Personal history of malignant melanoma of skin: Secondary | ICD-10-CM | POA: Diagnosis not present

## 2015-12-12 DIAGNOSIS — Z170 Estrogen receptor positive status [ER+]: Secondary | ICD-10-CM | POA: Insufficient documentation

## 2015-12-12 DIAGNOSIS — D259 Leiomyoma of uterus, unspecified: Secondary | ICD-10-CM | POA: Diagnosis not present

## 2015-12-12 DIAGNOSIS — Z9012 Acquired absence of left breast and nipple: Secondary | ICD-10-CM | POA: Insufficient documentation

## 2015-12-12 DIAGNOSIS — C50211 Malignant neoplasm of upper-inner quadrant of right female breast: Secondary | ICD-10-CM | POA: Diagnosis present

## 2015-12-12 DIAGNOSIS — C50411 Malignant neoplasm of upper-outer quadrant of right female breast: Secondary | ICD-10-CM | POA: Insufficient documentation

## 2015-12-12 DIAGNOSIS — C50911 Malignant neoplasm of unspecified site of right female breast: Secondary | ICD-10-CM | POA: Diagnosis not present

## 2015-12-12 DIAGNOSIS — D0511 Intraductal carcinoma in situ of right breast: Secondary | ICD-10-CM | POA: Diagnosis not present

## 2015-12-12 DIAGNOSIS — Z85850 Personal history of malignant neoplasm of thyroid: Secondary | ICD-10-CM | POA: Diagnosis not present

## 2015-12-12 DIAGNOSIS — Z79899 Other long term (current) drug therapy: Secondary | ICD-10-CM | POA: Insufficient documentation

## 2015-12-12 HISTORY — PX: MASTECTOMY W/ SENTINEL NODE BIOPSY: SHX2001

## 2015-12-12 HISTORY — PX: SIMPLE MASTECTOMY WITH AXILLARY SENTINEL NODE BIOPSY: SHX6098

## 2015-12-12 SURGERY — SIMPLE MASTECTOMY WITH AXILLARY SENTINEL NODE BIOPSY
Anesthesia: General | Site: Breast | Laterality: Right

## 2015-12-12 MED ORDER — KCL IN DEXTROSE-NACL 20-5-0.9 MEQ/L-%-% IV SOLN
INTRAVENOUS | Status: DC
  Administered 2015-12-12 – 2015-12-13 (×2): via INTRAVENOUS
  Filled 2015-12-12 (×3): qty 1000

## 2015-12-12 MED ORDER — OXYCODONE HCL 5 MG/5ML PO SOLN: 5.0000 mg | Freq: Once | ORAL | Status: DC | PRN

## 2015-12-12 MED ORDER — LACTATED RINGERS IV SOLN
INTRAVENOUS | Status: DC | PRN
  Administered 2015-12-12 (×2): via INTRAVENOUS

## 2015-12-12 MED ORDER — FENTANYL CITRATE (PF) 100 MCG/2ML IJ SOLN
INTRAMUSCULAR | Status: AC
  Administered 2015-12-12: 50 ug via INTRAVENOUS
  Filled 2015-12-12: qty 2

## 2015-12-12 MED ORDER — FENTANYL CITRATE (PF) 100 MCG/2ML IJ SOLN
INTRAMUSCULAR | Status: DC | PRN
  Administered 2015-12-12: 50 ug via INTRAVENOUS
  Administered 2015-12-12: 25 ug via INTRAVENOUS

## 2015-12-12 MED ORDER — MIDAZOLAM HCL 5 MG/5ML IJ SOLN
INTRAMUSCULAR | Status: DC | PRN
  Administered 2015-12-12 (×2): 1 mg via INTRAVENOUS

## 2015-12-12 MED ORDER — TECHNETIUM TC 99M SULFUR COLLOID FILTERED
1.0000 | Freq: Once | INTRAVENOUS | Status: AC | PRN
  Administered 2015-12-12: 1 via INTRADERMAL

## 2015-12-12 MED ORDER — MIDAZOLAM HCL 2 MG/2ML IJ SOLN
INTRAMUSCULAR | Status: AC
  Administered 2015-12-12: 12:00:00
  Filled 2015-12-12: qty 2

## 2015-12-12 MED ORDER — OXYCODONE-ACETAMINOPHEN 5-325 MG PO TABS
ORAL_TABLET | ORAL | Status: AC
  Administered 2015-12-12: 2 via ORAL
  Filled 2015-12-12: qty 2

## 2015-12-12 MED ORDER — PHENYLEPHRINE HCL 10 MG/ML IJ SOLN
10.0000 mg | INTRAVENOUS | Status: DC | PRN
  Administered 2015-12-12: 15 ug/min via INTRAVENOUS

## 2015-12-12 MED ORDER — FENTANYL CITRATE (PF) 250 MCG/5ML IJ SOLN
INTRAMUSCULAR | Status: AC
  Filled 2015-12-12: qty 5

## 2015-12-12 MED ORDER — METHYLENE BLUE 1 % INJ SOLN
INTRAMUSCULAR | Status: AC
  Filled 2015-12-12: qty 10

## 2015-12-12 MED ORDER — LEVOTHYROXINE SODIUM 112 MCG PO TABS
112.0000 ug | ORAL_TABLET | Freq: Every day | ORAL | Status: DC
  Filled 2015-12-12: qty 1

## 2015-12-12 MED ORDER — FENTANYL CITRATE (PF) 100 MCG/2ML IJ SOLN
25.0000 ug | INTRAMUSCULAR | Status: DC | PRN
  Administered 2015-12-12 (×3): 50 ug via INTRAVENOUS

## 2015-12-12 MED ORDER — LIDOCAINE HCL (CARDIAC) 20 MG/ML IV SOLN
INTRAVENOUS | Status: DC | PRN
  Administered 2015-12-12: 40 mg via INTRAVENOUS

## 2015-12-12 MED ORDER — OXYCODONE-ACETAMINOPHEN 5-325 MG PO TABS
1.0000 | ORAL_TABLET | ORAL | Status: DC | PRN
  Administered 2015-12-12: 1 via ORAL
  Administered 2015-12-12 – 2015-12-13 (×3): 2 via ORAL
  Filled 2015-12-12 (×3): qty 2

## 2015-12-12 MED ORDER — PANTOPRAZOLE SODIUM 40 MG IV SOLR
40.0000 mg | Freq: Every day | INTRAVENOUS | Status: DC
Start: 2015-12-12 — End: 2015-12-13
  Administered 2015-12-12: 40 mg via INTRAVENOUS
  Filled 2015-12-12: qty 40

## 2015-12-12 MED ORDER — PHENYLEPHRINE HCL 10 MG/ML IJ SOLN
INTRAMUSCULAR | Status: DC | PRN
  Administered 2015-12-12 (×2): 40 ug via INTRAVENOUS
  Administered 2015-12-12: 80 ug via INTRAVENOUS

## 2015-12-12 MED ORDER — PROPOFOL 10 MG/ML IV BOLUS
INTRAVENOUS | Status: AC
  Filled 2015-12-12: qty 20

## 2015-12-12 MED ORDER — ONDANSETRON 4 MG PO TBDP: 4.0000 mg | ORAL_TABLET | Freq: Four times a day (QID) | ORAL | Status: DC | PRN

## 2015-12-12 MED ORDER — ONDANSETRON HCL 4 MG/2ML IJ SOLN: 4.0000 mg | Freq: Four times a day (QID) | INTRAMUSCULAR | Status: DC | PRN

## 2015-12-12 MED ORDER — EPHEDRINE SULFATE 50 MG/ML IJ SOLN
INTRAMUSCULAR | Status: DC | PRN
  Administered 2015-12-12 (×2): 5 mg via INTRAVENOUS
  Administered 2015-12-12: 10 mg via INTRAVENOUS

## 2015-12-12 MED ORDER — SODIUM CHLORIDE 0.9 % IJ SOLN
INTRAMUSCULAR | Status: AC
  Filled 2015-12-12: qty 10

## 2015-12-12 MED ORDER — 0.9 % SODIUM CHLORIDE (POUR BTL) OPTIME
TOPICAL | Status: DC | PRN
  Administered 2015-12-12: 1000 mL

## 2015-12-12 MED ORDER — PROPOFOL 10 MG/ML IV BOLUS
INTRAVENOUS | Status: DC | PRN
  Administered 2015-12-12: 200 mg via INTRAVENOUS
  Administered 2015-12-12 (×2): 40 mg via INTRAVENOUS

## 2015-12-12 MED ORDER — OXYCODONE HCL 5 MG PO TABS: 5.0000 mg | ORAL_TABLET | Freq: Once | ORAL | Status: DC | PRN

## 2015-12-12 MED ORDER — METOCLOPRAMIDE HCL 5 MG/ML IJ SOLN: 10.0000 mg | Freq: Once | INTRAMUSCULAR | Status: DC | PRN

## 2015-12-12 MED ORDER — ONDANSETRON HCL 4 MG/2ML IJ SOLN
INTRAMUSCULAR | Status: DC | PRN
  Administered 2015-12-12: 4 mg via INTRAVENOUS

## 2015-12-12 MED ORDER — HEPARIN SODIUM (PORCINE) 5000 UNIT/ML IJ SOLN
5000.0000 [IU] | Freq: Three times a day (TID) | INTRAMUSCULAR | Status: DC
  Administered 2015-12-13: 5000 [IU] via SUBCUTANEOUS
  Filled 2015-12-12: qty 1

## 2015-12-12 MED ORDER — MORPHINE SULFATE (PF) 2 MG/ML IV SOLN: 1.0000 mg | INTRAVENOUS | Status: DC | PRN

## 2015-12-12 SURGICAL SUPPLY — 51 items
APPLIER CLIP 9.375 MED OPEN (MISCELLANEOUS) ×3
BINDER BREAST XLRG (GAUZE/BANDAGES/DRESSINGS) ×3 IMPLANT
CANISTER SUCTION 2500CC (MISCELLANEOUS) ×6 IMPLANT
CHLORAPREP W/TINT 26ML (MISCELLANEOUS) ×3 IMPLANT
CLIP APPLIE 9.375 MED OPEN (MISCELLANEOUS) ×1 IMPLANT
CONT SPEC 4OZ CLIKSEAL STRL BL (MISCELLANEOUS) ×3 IMPLANT
COVER PROBE W GEL 5X96 (DRAPES) ×3 IMPLANT
COVER SURGICAL LIGHT HANDLE (MISCELLANEOUS) ×3 IMPLANT
DEVICE DISSECT PLASMABLAD 3.0S (MISCELLANEOUS) ×1 IMPLANT
DRAIN CHANNEL 19F RND (DRAIN) ×3 IMPLANT
DRAPE CHEST BREAST 15X10 FENES (DRAPES) ×3 IMPLANT
DRSG PAD ABDOMINAL 8X10 ST (GAUZE/BANDAGES/DRESSINGS) ×3 IMPLANT
ELECT CAUTERY BLADE 6.4 (BLADE) ×3 IMPLANT
ELECT REM PT RETURN 9FT ADLT (ELECTROSURGICAL) ×3
ELECTRODE REM PT RTRN 9FT ADLT (ELECTROSURGICAL) ×1 IMPLANT
EVACUATOR SILICONE 100CC (DRAIN) ×3 IMPLANT
GAUZE SPONGE 4X4 12PLY STRL (GAUZE/BANDAGES/DRESSINGS) ×3 IMPLANT
GLOVE BIO SURGEON STRL SZ7.5 (GLOVE) ×6 IMPLANT
GLOVE BIO SURGEON STRL SZ8.5 (GLOVE) ×3 IMPLANT
GLOVE BIOGEL PI IND STRL 7.0 (GLOVE) ×1 IMPLANT
GLOVE BIOGEL PI IND STRL 7.5 (GLOVE) ×1 IMPLANT
GLOVE BIOGEL PI IND STRL 8 (GLOVE) ×1 IMPLANT
GLOVE BIOGEL PI INDICATOR 7.0 (GLOVE) ×2
GLOVE BIOGEL PI INDICATOR 7.5 (GLOVE) ×2
GLOVE BIOGEL PI INDICATOR 8 (GLOVE) ×2
GLOVE SURG SS PI 7.0 STRL IVOR (GLOVE) ×3 IMPLANT
GOWN STRL REUS W/ TWL LRG LVL3 (GOWN DISPOSABLE) ×3 IMPLANT
GOWN STRL REUS W/ TWL XL LVL3 (GOWN DISPOSABLE) ×1 IMPLANT
GOWN STRL REUS W/TWL LRG LVL3 (GOWN DISPOSABLE) ×6
GOWN STRL REUS W/TWL XL LVL3 (GOWN DISPOSABLE) ×2
KIT BASIN OR (CUSTOM PROCEDURE TRAY) ×3 IMPLANT
KIT ROOM TURNOVER OR (KITS) ×3 IMPLANT
LIQUID BAND (GAUZE/BANDAGES/DRESSINGS) ×3 IMPLANT
NEEDLE 18GX1X1/2 (RX/OR ONLY) (NEEDLE) IMPLANT
NEEDLE HYPO 25GX1X1/2 BEV (NEEDLE) IMPLANT
NS IRRIG 1000ML POUR BTL (IV SOLUTION) ×3 IMPLANT
PACK GENERAL/GYN (CUSTOM PROCEDURE TRAY) ×3 IMPLANT
PAD ARMBOARD 7.5X6 YLW CONV (MISCELLANEOUS) ×3 IMPLANT
PLASMABLADE 3.0S (MISCELLANEOUS) ×3
SPECIMEN JAR X LARGE (MISCELLANEOUS) ×3 IMPLANT
SPONGE GAUZE 4X4 12PLY STER LF (GAUZE/BANDAGES/DRESSINGS) ×3 IMPLANT
SUT ETHILON 3 0 FSL (SUTURE) ×3 IMPLANT
SUT MNCRL AB 4-0 PS2 18 (SUTURE) ×3 IMPLANT
SUT VIC AB 3-0 54X BRD REEL (SUTURE) ×1 IMPLANT
SUT VIC AB 3-0 BRD 54 (SUTURE) ×2
SUT VIC AB 3-0 SH 18 (SUTURE) ×3 IMPLANT
SYR CONTROL 10ML LL (SYRINGE) IMPLANT
TOWEL OR 17X24 6PK STRL BLUE (TOWEL DISPOSABLE) ×3 IMPLANT
TOWEL OR 17X26 10 PK STRL BLUE (TOWEL DISPOSABLE) ×3 IMPLANT
TUBE CONNECTING 12'X1/4 (SUCTIONS) ×2
TUBE CONNECTING 12X1/4 (SUCTIONS) ×4 IMPLANT

## 2015-12-12 NOTE — Anesthesia Preprocedure Evaluation (Addendum)
Anesthesia Evaluation  Patient identified by MRN, date of birth, ID band Patient awake    Reviewed: Allergy & Precautions, NPO status , Patient's Chart, lab work & pertinent test results  Airway Mallampati: II  TM Distance: >3 FB Neck ROM: Full    Dental  (+) Teeth Intact, Dental Advisory Given   Pulmonary    breath sounds clear to auscultation       Cardiovascular  Rhythm:Regular Rate:Normal     Neuro/Psych    GI/Hepatic   Endo/Other    Renal/GU      Musculoskeletal   Abdominal   Peds  Hematology   Anesthesia Other Findings   Reproductive/Obstetrics                            Anesthesia Physical Anesthesia Plan  ASA: II  Anesthesia Plan: General   Post-op Pain Management: GA combined w/ Regional for post-op pain   Induction: Intravenous  Airway Management Planned: LMA  Additional Equipment:   Intra-op Plan:   Post-operative Plan:   Informed Consent: I have reviewed the patients History and Physical, chart, labs and discussed the procedure including the risks, benefits and alternatives for the proposed anesthesia with the patient or authorized representative who has indicated his/her understanding and acceptance.   Dental advisory given  Plan Discussed with: CRNA and Anesthesiologist  Anesthesia Plan Comments:         Anesthesia Quick Evaluation

## 2015-12-12 NOTE — H&P (Signed)
Dawn Riggs Location: [facility] Patient #: [id number] DOB: 01-Jun-1941 Married / Language: English / Race: White Female   History of Present Illness  Patient words: right breast ca.  The patient is a 74 year old female who presents with breast cancer. We are asked to see the patient in consultation by Dr. Ammie Ferrier to evaluate her for a right breast cancer. The patient is a 74 year old white female who recently went for a routine screening mammogram. At that time she was found to have a 5 mm area of abnormality in the upper outer quadrant of the right breast. 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   Other Problems  Back Pain Breast Cancer Lump In Breast Melanoma Migraine Headache Thyroid Cancer Thyroid Disease  Past Surgical History  Breast Biopsy Bilateral. Cataract Surgery Bilateral. Colon Polyp Removal - Colonoscopy Gallbladder Surgery - Laparoscopic Mastectomy Left. Spinal Surgery - Lower Back Thyroid Surgery  Diagnostic Studies History  Colonoscopy within last year Mammogram within last year Pap Smear 1-5 years ago  Allergies  No Known Drug Allergies12/06/2015  Medication History  Levothroid (112MCG Tablet, Oral) Active. Multivitamins (Oral) Active. Medications Reconciled  Social History Caffeine use Coffee, Tea. No alcohol use No drug use Tobacco use Never smoker.  Family History  Arthritis Daughter, Mother. Cerebrovascular Accident Mother. Heart Disease Brother, Mother. Hypertension Brother, Mother. Kidney Disease Father. Migraine Headache Daughter, Mother.  Pregnancy / Birth History  Age at menarche 68 years. Age of menopause 58-55 Gravida 2 Maternal age 62-25 Para 2    Review of Systems General Not  Present- Appetite Loss, Chills, Fatigue, Fever, Night Sweats, Weight Gain and Weight Loss. Skin Not Present- Change in Wart/Mole, Dryness, Hives, Jaundice, New Lesions, Non-Healing Wounds, Rash and Ulcer. HEENT Not Present- Earache, Hearing Loss, Hoarseness, Nose Bleed, Oral Ulcers, Ringing in the Ears, Seasonal Allergies, Sinus Pain, Sore Throat, Visual Disturbances, Wears glasses/contact lenses and Yellow Eyes. Respiratory Not Present- Bloody sputum, Chronic Cough, Difficulty Breathing, Snoring and Wheezing. Breast Present- Breast Mass. Not Present- Breast Pain, Nipple Discharge and Skin Changes. Cardiovascular Not Present- Chest Pain, Difficulty Breathing Lying Down, Leg Cramps, Palpitations, Rapid Heart Rate, Shortness of Breath and Swelling of Extremities. Gastrointestinal Not Present- Abdominal Pain, Bloating, Bloody Stool, Change in Bowel Habits, Chronic diarrhea, Constipation, Difficulty Swallowing, Excessive gas, Gets full quickly at meals, Hemorrhoids, Indigestion, Nausea, Rectal Pain and Vomiting. Female Genitourinary Not Present- Frequency, Nocturia, Painful Urination, Pelvic Pain and Urgency. Musculoskeletal Not Present- Back Pain, Joint Pain, Joint Stiffness, Muscle Pain, Muscle Weakness and Swelling of Extremities. Neurological Not Present- Decreased Memory, Fainting, Headaches, Numbness, Seizures, Tingling, Tremor, Trouble walking and Weakness. Psychiatric Not Present- Anxiety, Bipolar, Change in Sleep Pattern, Depression, Fearful and Frequent crying. Endocrine Not Present- Cold Intolerance, Excessive Hunger, Hair Changes, Heat Intolerance, Hot flashes and New Diabetes. Hematology Not Present- Easy Bruising, Excessive bleeding, Gland problems, HIV and Persistent Infections.  Vitals   Weight: 184 lb Height: 66in Body Surface Area: 1.93 m Body Mass Index: 29.7 kg/m  Temp.: 97.87F(Temporal)  Pulse: 80 (Regular)  BP: 126/80 (Sitting, Left Arm,  Standard)       Physical Exam  General Mental Status-Alert. General Appearance-Consistent with stated age. Hydration-Well hydrated. Voice-Normal.  Head and Neck Head-normocephalic, atraumatic with no lesions or palpable masses. Trachea-midline. Thyroid Gland Characteristics - normal  size and consistency. Note: There is a small round well circumscribed palpable mass just anterior to her trachea near her previous thyroidectomy incision   Eye Eyeball - Bilateral-Extraocular movements intact. Sclera/Conjunctiva - Bilateral-No scleral icterus.  Chest and Lung Exam Chest and lung exam reveals -quiet, even and easy respiratory effort with no use of accessory muscles and on auscultation, normal breath sounds, no adventitious sounds and normal vocal resonance. Inspection Chest Wall - Normal. Back - normal.  Breast Note: The left mastectomy incision has healed nicely. There is no palpable mass of the left chest wall. There is no palpable mass of the right breast. There is no palpable axillary, supraclavicular, or cervical lymphadenopathy.   Cardiovascular Cardiovascular examination reveals -normal heart sounds, regular rate and rhythm with no murmurs and normal pedal pulses bilaterally.  Abdomen Inspection Inspection of the abdomen reveals - No Hernias. Skin - Scar - no surgical scars. Palpation/Percussion Palpation and Percussion of the abdomen reveal - Soft, Non Tender, No Rebound tenderness, No Rigidity (guarding) and No hepatosplenomegaly. Auscultation Auscultation of the abdomen reveals - Bowel sounds normal.  Neurologic Neurologic evaluation reveals -alert and oriented x 3 with no impairment of recent or remote memory. Mental Status-Normal.  Musculoskeletal Normal Exam - Left-Upper Extremity Strength Normal and Lower Extremity Strength Normal. Normal Exam - Right-Upper Extremity Strength Normal and Lower Extremity Strength  Normal.  Lymphatic Head & Neck  General Head & Neck Lymphatics: Bilateral - Description - Normal. Axillary  General Axillary Region: Bilateral - Description - Normal. Tenderness - Non Tender. Femoral & Inguinal  Generalized Femoral & Inguinal Lymphatics: Bilateral - Description - Normal. Tenderness - Non Tender.    Assessment & Plan  BREAST CANCER OF UPPER-OUTER QUADRANT OF RIGHT FEMALE BREAST (C50.411) Impression: The patient appears to have a small early-stage cancer in the upper outer quadrant of the right breast. I have discussed with her in detail the options for treatment and at this point she favors mastectomy. I think this is a very reasonable way of treating her breast cancer. She is also a good candidate for sentinel node mapping given that her lymph nodes are clinically negative. I have discussed with her in detail the risks and benefits of the operation to do this as well as some of the technical aspects and she understands and wishes to proceed. With her history of thyroid cancer in the palpable mass on her anterior neck I have also recommended that she have an ultrasound and fine-needle aspiration of this. Current Plans Pt Education - Breast Cancer: discussed with patient and provided information. [facility] [place] [phone])   Signed by Luella Cook, MD

## 2015-12-12 NOTE — Interval H&P Note (Signed)
History and Physical Interval Note:  12/12/2015 7:11 AM  Dawn Riggs  has presented today for surgery, with the diagnosis of RIGHT BREAST CANCER  The various methods of treatment have been discussed with the patient and family. After consideration of risks, benefits and other options for treatment, the patient has consented to  Procedure(s): [place] (Right) as a surgical intervention .  The patient's history has been reviewed, patient examined, no change in status, stable for surgery.  I have reviewed the patient's chart and labs.  Questions were answered to the patient's satisfaction.     TOTH III,PAUL S

## 2015-12-12 NOTE — Op Note (Signed)
12/12/2015  8:46 AM  PATIENT:  Dawn Riggs  74 y.o. female  PRE-OPERATIVE DIAGNOSIS:  RIGHT BREAST CANCER  POST-OPERATIVE DIAGNOSIS:  RIGHT BREAST CANCER  PROCEDURE:  Procedure(s): RIGHT MASTECTOMY WITH SENTINEL NODE MAPPING (Right)  SURGEON:  Surgeon(s) and Role:    * Jovita Kussmaul, MD - Primary  PHYSICIAN ASSISTANT:   ASSISTANTS: Sharyn Dross, RNFA   ANESTHESIA:   general  EBL:  Total I/O In: 49 [I.V.:650] Out: -   BLOOD ADMINISTERED:none  DRAINS: (1) Jackson-Pratt drain(s) with closed bulb suction in the prepectoral space   LOCAL MEDICATIONS USED:  NONE  SPECIMEN:  Source of Specimen:  right mastectomy  DISPOSITION OF SPECIMEN:  PATHOLOGY  COUNTS:  YES  TOURNIQUET:  * No tourniquets in log *  DICTATION: .Dragon Dictation   After informed consent was obtained the patient was brought to the operating room and placed in the supine position on the operating table. After adequate induction of general anesthesia the patient's right chest, breast, and axillary area were prepped with ChloraPrep, allowed to dry, and draped in usual sterile manner. Earlier in the day the patient underwent injection of 1 mCi of technetium sulfur colloid in the subareolar position on the right. The neoprobe was used to examine the right axilla and there was a good radioactive signal there. Next an elliptical incision was made around the nipple and areola complex in order to minimize any excess skin. The incision was carried through the skin and subcutaneous tissue sharply with the plasma blade. Breast hooks were used to elevate the skin flaps anteriorly towards the ceiling and thin skin flaps were created circumferentially between the breast tissue and subcutaneous fat. This dissection was carried circumferentially all the way to the chest wall. Once the dissection reached the axilla of the neoprobe was used to identify an area of increased radioactivity in the axilla. The neoprobe was used  to direct blunt hemostat dissection in this area. I was able to identify a lymph node with increased radioactivity. This was excised sharply with the plasma blade. Small vessels around the node were controlled with clips. Ex vivo counts on this sentinel node were approximately 1000. There were no other hot or palpable lymph nodes identified in the right axilla. Next the breast was removed from the pectoralis muscle with the pectoralis fashion from the top down. This was also done sharply with the plasma blade. Once the breast was removed it was oriented with a stitch on the lateral skin and sent pathology for further evaluation. Several small intercostal brachial nerves and vessels along the chest wall were controlled with clips. The wound was irrigated with copious amounts of saline and found to be hemostatic. A small stab incision was made near the anterior axillary line inferior to the operative bed. A tonsil clamp was placed through this opening and used to bring a 19 [place] round Blake drain into the operative bed. The drain was curled along the chest wall. The drain was anchored to the skin with a 3-0 nylon stitch. Next the superior and inferior skin flaps were healthy appearing. They were grossly reapproximated with interrupted 3-0 Vicryl stitches. The skin was then closed with a running 4-0 Monocryl subcuticular stitch. Dermabond dressings were applied. The drain was placed to bulb suction and the was a good seal. The patient tolerated the procedure well. At the end of the case all needle sponge and instrument counts were correct. The patient was then awakened and taken to recovery in stable condition.  PLAN OF CARE: Admit for overnight observation  PATIENT DISPOSITION:  PACU - hemodynamically stable.   Delay start of Pharmacological VTE agent (>24hrs) due to surgical blood loss or risk of bleeding: no

## 2015-12-12 NOTE — Anesthesia Postprocedure Evaluation (Signed)
Anesthesia Post Note  Patient: Dawn Riggs  Procedure(s) Performed: Procedure(s) (LRB): RIGHT MASTECTOMY WITH SENTINEL NODE MAPPING (Right)  Patient location during evaluation: PACU Anesthesia Type: General and Regional Level of consciousness: awake and awake and alert Pain management: pain level controlled Vital Signs Assessment: post-procedure vital signs reviewed and stable Respiratory status: spontaneous breathing and nonlabored ventilation Anesthetic complications: no    Last Vitals:  Filed Vitals:   12/12/15 1000 12/12/15 1045  BP: 139/62 153/68  Pulse: 72 78  Temp: 36.7 C 36.5 C  Resp: 10 16    Last Pain:  Filed Vitals:   12/12/15 1346  PainSc: Asleep                 Naheem Mosco COKER

## 2015-12-12 NOTE — Anesthesia Procedure Notes (Signed)
Procedure Name: LMA Insertion Date/Time: 12/12/2015 7:39 AM Performed by: Tressia Miners LEFFEW Pre-anesthesia Checklist: Patient identified, Emergency Drugs available, Suction available, Timeout performed and Patient being monitored Patient Re-evaluated:Patient Re-evaluated prior to inductionOxygen Delivery Method: Circle system utilized Preoxygenation: Pre-oxygenation with 100% oxygen Intubation Type: IV induction Ventilation: Mask ventilation without difficulty LMA: LMA inserted LMA Size: 4.0 Number of attempts: 1 Tube secured with: Tape Dental Injury: Teeth and Oropharynx as per pre-operative assessment

## 2015-12-12 NOTE — Transfer of Care (Signed)
Immediate Anesthesia Transfer of Care Note  Patient: Dawn Riggs  Procedure(s) Performed: Procedure(s): RIGHT MASTECTOMY WITH SENTINEL NODE MAPPING (Right)  Patient Location: PACU  Anesthesia Type:General  Level of Consciousness: awake, patient cooperative and responds to stimulation  Airway & Oxygen Therapy: Patient Spontanous Breathing and Patient connected to nasal cannula oxygen  Post-op Assessment: Report given to RN, Post -op Vital signs reviewed and stable and Patient moving all extremities X 4  Post vital signs: Reviewed and stable  Last Vitals:  Filed Vitals:   12/12/15 0604  BP: 159/67  Pulse: 73  Temp: 36.8 C  Resp: 16    Complications: No apparent anesthesia complications

## 2015-12-13 ENCOUNTER — Encounter (HOSPITAL_COMMUNITY): Payer: Self-pay | Admitting: General Surgery

## 2015-12-13 DIAGNOSIS — G43909 Migraine, unspecified, not intractable, without status migrainosus: Secondary | ICD-10-CM | POA: Diagnosis not present

## 2015-12-13 DIAGNOSIS — Z9012 Acquired absence of left breast and nipple: Secondary | ICD-10-CM | POA: Diagnosis not present

## 2015-12-13 DIAGNOSIS — C50411 Malignant neoplasm of upper-outer quadrant of right female breast: Secondary | ICD-10-CM | POA: Diagnosis not present

## 2015-12-13 DIAGNOSIS — Z79899 Other long term (current) drug therapy: Secondary | ICD-10-CM | POA: Diagnosis not present

## 2015-12-13 DIAGNOSIS — Z85850 Personal history of malignant neoplasm of thyroid: Secondary | ICD-10-CM | POA: Diagnosis not present

## 2015-12-13 DIAGNOSIS — Z85820 Personal history of malignant melanoma of skin: Secondary | ICD-10-CM | POA: Diagnosis not present

## 2015-12-13 MED ORDER — PANTOPRAZOLE SODIUM 40 MG PO TBEC: 40.0000 mg | DELAYED_RELEASE_TABLET | Freq: Every day | ORAL | Status: DC

## 2015-12-13 MED ORDER — OXYCODONE-ACETAMINOPHEN 5-325 MG PO TABS: 1.0000 | ORAL_TABLET | ORAL | Status: DC | PRN

## 2015-12-13 NOTE — Progress Notes (Signed)
IV removed. AVS given to patient. Drain teaching understanding demonstrated.  Transportation arranged by patient.  Belongings packed.

## 2015-12-13 NOTE — Progress Notes (Signed)
1 Day Post-Op  Subjective: No complaints. Ready to go home  Objective: Vital signs in last 24 hours: Temp:  [97.5 F (36.4 C)-98.4 F (36.9 C)] 98.4 F (36.9 C) (12/23 0954) Pulse Rate:  [60-81] 65 (12/23 0954) Resp:  [14-19] 18 (12/23 0954) BP: (101-131)/(53-73) 131/61 mmHg (12/23 0954) SpO2:  [96 %-99 %] 98 % (12/23 0954) Last BM Date: 12/11/15  Intake/Output from previous day: 12/22 0701 - 12/23 0700 In: 3281.3 [P.O.:1240; I.V.:2041.3] Out: 1110 [Urine:950; Drains:145; Blood:15] Intake/Output this shift: Total I/O In: 357.5 [P.O.:240; I.V.:117.5] Out: 200 [Urine:200]  Resp: clear to auscultation bilaterally Chest wall: skin flaps look good Cardio: regular rate and rhythm GI: soft, non-tender; bowel sounds normal; no masses,  no organomegaly  Lab Results:  No results for input(s): WBC, HGB, HCT, PLT in the last 72 hours. BMET No results for input(s): NA, K, CL, CO2, GLUCOSE, BUN, CREATININE, CALCIUM in the last 72 hours. PT/INR No results for input(s): LABPROT, INR in the last 72 hours. ABG No results for input(s): PHART, HCO3 in the last 72 hours.  Invalid input(s): PCO2, PO2  Studies/Results: Nm Sentinel Node Inj-no Rpt (breast)  12/12/2015  CLINICAL DATA: right breast cancer Sulfur colloid was injected intradermally by the nuclear medicine technologist for breast cancer sentinel node localization.    Anti-infectives: Anti-infectives    Start     Dose/Rate Route Frequency Ordered Stop   12/12/15 0700  ceFAZolin (ANCEF) IVPB 2 g/50 mL premix     2 g 100 mL/hr over 30 Minutes Intravenous To ShortStay Surgical 12/11/15 1048 12/12/15 0740      Assessment/Plan: s/p Procedure(s): RIGHT MASTECTOMY WITH SENTINEL NODE MAPPING (Right) Advance diet Discharge     TOTH III,Merna Baldi S 12/13/2015

## 2015-12-13 NOTE — Discharge Summary (Signed)
Physician Discharge Summary  Patient ID: Dawn Riggs MRN: [id number] DOB/AGE: [birth year] 74 y.o.  Admit date: 12/12/2015 Discharge date: 12/13/2015  Admission Diagnoses:  Discharge Diagnoses:  Active Problems:   Breast cancer, female, right   Discharged Condition: good  Hospital Course: the pt underwent right mastectomy. She tolerated surgery well. On pod 1 she was ready for discharge home  Consults: None  Significant Diagnostic Studies: none  Treatments: surgery: as above  Discharge Exam: Blood pressure 131/61, pulse 65, temperature 98.4 F (36.9 C), temperature source Oral, resp. rate 18, height 5\' 6"  (1.676 m), weight 86.365 kg (190 lb 6.4 oz), last menstrual period 12/22/1995, SpO2 98 %. Resp: clear to auscultation bilaterally Chest wall: skin flaps look good Cardio: regular rate and rhythm GI: soft, non-tender; bowel sounds normal; no masses,  no organomegaly  Disposition: 01-Home or Self Care  Discharge Instructions    Call MD for:  difficulty breathing, headache or visual disturbances    Complete by:  As directed      Call MD for:  extreme fatigue    Complete by:  As directed      Call MD for:  hives    Complete by:  As directed      Call MD for:  persistant dizziness or light-headedness    Complete by:  As directed      Call MD for:  persistant nausea and vomiting    Complete by:  As directed      Call MD for:  redness, tenderness, or signs of infection (pain, swelling, redness, odor or green/yellow discharge around incision site)    Complete by:  As directed      Call MD for:  severe uncontrolled pain    Complete by:  As directed      Call MD for:  temperature >100.4    Complete by:  As directed      Diet - low sodium heart healthy    Complete by:  As directed      Discharge instructions    Complete by:  As directed   Sponge bathe while drains are in. Empty drains, record output, and recharge bulb twice a day. Diet as tolerated     Increase  activity slowly    Complete by:  As directed      No wound care    Complete by:  As directed             Medication List    TAKE these medications        acetaminophen 500 MG tablet  Commonly known as:  TYLENOL  Take 1,000 mg by mouth every 6 (six) hours as needed.     CENTRUM SILVER ADULT 50+ Tabs  Take 1 tablet by mouth daily.     cholecalciferol 400 UNITS Tabs tablet  Commonly known as:  VITAMIN D  Take 2,000 Units by mouth daily.     ibuprofen 200 MG tablet  Commonly known as:  ADVIL,MOTRIN  Take 200 mg by mouth every 6 (six) hours as needed.     oxyCODONE-acetaminophen 5-325 MG tablet  Commonly known as:  ROXICET  Take 1-2 tablets by mouth every 4 (four) hours as needed.     UNITHROID 112 MCG tablet  Generic drug:  levothyroxine  Take 112 mcg by mouth daily.           Follow-up Information    Follow up with Merrie Roof, MD In 2 weeks.   Specialty:  General Surgery  Contact information:   [address] [place] [address] [phone]       Signed: Merrie Roof 12/13/2015, 11:59 AM

## 2015-12-31 ENCOUNTER — Other Ambulatory Visit (HOSPITAL_COMMUNITY)
Admission: RE | Admit: 2015-12-31 | Discharge: 2015-12-31 | Disposition: A | Discharge status: Home / Self Care | Payer: Medicare Other | Source: Ambulatory Visit | Attending: Physician Assistant | Admitting: Physician Assistant

## 2015-12-31 ENCOUNTER — Ambulatory Visit
Admission: RE | Admit: 2015-12-31 | Discharge: 2015-12-31 | Disposition: A | Discharge status: Home / Self Care | Payer: Medicare Other | Source: Ambulatory Visit | Attending: General Surgery | Admitting: General Surgery

## 2015-12-31 DIAGNOSIS — R222 Localized swelling, mass and lump, trunk: Secondary | ICD-10-CM | POA: Diagnosis not present

## 2015-12-31 DIAGNOSIS — E041 Nontoxic single thyroid nodule: Secondary | ICD-10-CM | POA: Diagnosis not present

## 2015-12-31 NOTE — Procedures (Signed)
Using direct ultrasound guidance, 4 passes were made using needles into the nodule/midline mass superior to the suprasternal notch.  Ultrasound was used to confirm needle placements on all occasions.   Specimens were sent to Pathology for analysis.   Demitri Kucinski S Cornelious Diven PA-C 10/15/2015 1:43 PM

## 2016-01-16 ENCOUNTER — Ambulatory Visit: Payer: Medicare Other | Attending: General Surgery | Admitting: Physical Therapy

## 2016-01-16 ENCOUNTER — Telehealth: Payer: Self-pay | Admitting: Hematology and Oncology

## 2016-01-16 DIAGNOSIS — M25611 Stiffness of right shoulder, not elsewhere classified: Secondary | ICD-10-CM | POA: Insufficient documentation

## 2016-01-16 NOTE — Patient Instructions (Signed)
[email or web address] Courses Online Strength After Breast Cancer Look at the right of the page for Lymphedema Education Session  

## 2016-01-16 NOTE — Telephone Encounter (Signed)
New breast appt-s/w patient and gave np appt for 02/02 @ 1 w/Dr. Lindi Adie.  Referring Dr. Marlou Starks  Referral information scanned.Marland Kitchen

## 2016-01-16 NOTE — Therapy (Signed)
[place], [place], [address] Phone: [phone]   Fax:  [phone]  Physical Therapy Evaluation  Patient Details  Name: Dawn Riggs MRN: [id number] Date of Birth: 07/02/1941 Referring Provider: Dr. Marlou Starks   Encounter Date: 01/16/2016      PT End of Session - 01/16/16 1439    Visit Number 1   PT Start Time 1350   PT Stop Time 1430   PT Time Calculation (min) 40 min   Activity Tolerance Patient tolerated treatment well   Behavior During Therapy [facility] for tasks assessed/performed      Past Medical History  Diagnosis Date  . Osteopenia   . Fibroids   . Complication of anesthesia   . PONV (postoperative nausea and vomiting)   . Hypothyroidism   . Cataract     bilateral- removed 02/2015  . 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 Breast cancer ([place])   . Cancer of skin of leg   . Headache     hormonal headache - now resolved    Past Surgical History  Procedure Laterality Date  . Thyroidectomy  [id number]    Dr Romelle Starcher, PA  . Laminectomy  [id number]  . Lipoma excision  08/2000    L posterior arm   . Breast surgery Left 1997  . Skin cancer excision Right   . Cholecystectomy N/A 09/07/2013    Procedure: LAPAROSCOPIC CHOLECYSTECTOMY WITH INTRAOPERATIVE CHOLANGIOGRAM;  Surgeon: Merrie Roof, MD;  Location: [place];  Service: General;  Laterality: N/A;  . Back surgery      L4-5  . Eye surgery      bilateral IOL, following cataracts removed   . [facility]  . [facility]lect[facility]    . Mastectomy w/ sentinel node biopsy Right 12/12/2015  . Simple mastectomy with axillary sentinel node biopsy Right 12/12/2015    Procedure: RIGHT MASTECTOMY WITH SENTINEL NODE MAPPING;  Surgeon: Autumn Messing III, MD;  Location: [place];  Service: General;  Laterality: Right;    There were no vitals filed for this visit.  Visit Diagnosis:  Stiffness of right shoulder  joint - Plan: PT plan of care cert/re-cert      Subjective Assessment - 01/16/16 1405    Pertinent History right mastectomy 12/11/2016 with one lymph node removed , left mastectomy 19 years ago has not ever needed chemo or radiation             [facility] PT Assessment - 01/16/16 0001    Assessment   Medical Diagnosis right breast cancer    Referring Provider Dr. Marlou Starks    Onset Date/Surgical Date 12/12/15   Hand Dominance Right   Precautions   Precautions Other (comment)   Precaution Comments cancer   Restrictions   Weight Bearing Restrictions No   Balance Screen   Has the patient fallen in the past 6 months No   Has the patient had a decrease in activity level because of a fear of falling?  No   Is the patient reluctant to leave their home because of a fear of falling?  No   Home Environment   Living Environment Private residence   Living Arrangements Spouse/significant other   Prior Function   Level of [place] walks  3 miles a week for 2 miles    Cognition   Overall Cognitive Status Within Functional Limits for tasks assessed  Observation/Other Assessments   Observations healing incisions from right mastectomy    Skin Integrity no open areas    Coordination   Gross Motor Movements are Fluid and Coordinated Yes   Posture/Postural Control   Posture/Postural Control No significant limitations   ROM / Strength   AROM / PROM / Strength AROM   AROM   Right Shoulder Flexion 146 Degrees   Right Shoulder ABduction 155 Degrees   Right Shoulder External Rotation 90 Degrees   Left Shoulder Flexion 159 Degrees   Left Shoulder ABduction 160 Degrees   Left Shoulder External Rotation 90 Degrees   Strength   Overall Strength Within functional limits for tasks performed           LYMPHEDEMA/ONCOLOGY QUESTIONNAIRE - 01/16/16 1411    Right Upper Extremity Lymphedema   10 cm Proximal to Olecranon Process 31 cm   Olecranon Process 27.3 cm   10 cm  Proximal to Ulnar Styloid Process 25.7 cm   Just Proximal to Ulnar Styloid Process 17.9 cm   Across Hand at [employer] 19.5 cm   At [place] of 2nd Digit 6.8 cm   Left Upper Extremity Lymphedema   10 cm Proximal to Olecranon Process 30.5 cm   Olecranon Process 26.5 cm   10 cm Proximal to Ulnar Styloid Process 25 cm   Just Proximal to Ulnar Styloid Process 17.7 cm   Across Hand at [employer] 20 cm   At [place] of 2nd Digit 6.8 cm           Quick Dash - 01/16/16 0001    Open a tight or new jar No difficulty   Do heavy household chores (wash walls, wash floors) No difficulty   Carry a shopping bag or briefcase No difficulty   Wash your back No difficulty   Use a knife to cut food No difficulty   Recreational activities in which you take some force or impact through your arm, shoulder, or hand (golf, hammering, tennis) Mild difficulty   During the past week, to what extent has your arm, shoulder or hand problem interfered with your normal social activities with family, friends, neighbors, or groups? Not at all   During the past week, to what extent has your arm, shoulder or hand problem limited your work or other regular daily activities Not at all   Arm, shoulder, or hand pain. None   Tingling (pins and needles) in your arm, shoulder, or hand None   Difficulty Sleeping No difficulty   DASH Score 2.27 %                     PT Education - 01/16/16 1438    Education provided Yes   Education Details wall stretches for ROM and how to progress strengthening intesnsity, link for lymphedema education session , Strength ABC exercise program    Person(s) Educated Patient   Methods Explanation;Handout   Comprehension Verbalized understanding                [facility] Goals - 01/16/16 1443    CC Long Term Goal  #1   Title Pt will report and understanding of how to progress exercise for right UE so that she can return to prior function   Time 1   Period  Days   Status Achieved            Plan - 01/16/16 1440    Clinical Impression Statement Pt is 5 weeks post  right mastectomy with one node removed with no chemo or radiation therapy.  Pt has some decreased range of motion in right shoulder and was given some stretches to increase range.  She was also encouraged to add low intesity , but progressive strengthening exercise and she was given a suggested program.  She is familar with exercise and feels that she will be able to continue on her own. She is at low risk for lymphedema .  She does not need further PT but knows to call if she has futher issues.    Pt will benefit from skilled therapeutic intervention in order to improve on the following deficits Decreased range of motion;Decreased knowledge of precautions   Rehab Potential Good   PT Frequency One time visit   PT Treatment/Interventions Patient/family education          G-Codes - Feb 07, 2016 1437    Functional Assessment Tool Used Quick DASH    Functional Limitation Carrying, moving and handling objects   Carrying, Moving and Handling Objects Current Status [id number]) 0 percent impaired, limited or restricted   Carrying, Moving and Handling Objects Goal Status [id number]) 0 percent impaired, limited or restricted   Carrying, Moving and Handling Objects Discharge Status [id number]) 0 percent impaired, limited or restricted       Problem List Patient Active Problem List   Diagnosis Date Noted  . Breast cancer, female, right 12/12/2015  . Gallstones 08/24/2013  . HX: breast cancer 10/21/2012  . 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 Osteopenia   . Fibroids   . Cancer of skin of leg    Donato Heinz. Owens Shark, PT   07-Feb-2016, 2:47 PM  [place], [place], [address] Phone: [phone]   Fax:  [phone]  Name: Dawn Riggs MRN: [id number] Date of Birth: [birth year]

## 2016-01-17 ENCOUNTER — Telehealth: Payer: Self-pay | Admitting: *Deleted

## 2016-01-17 NOTE — Telephone Encounter (Signed)
Mailed new pt packet to pt.  

## 2016-01-23 ENCOUNTER — Encounter: Payer: Self-pay | Admitting: *Deleted

## 2016-01-23 ENCOUNTER — Encounter: Payer: Self-pay | Admitting: Hematology and Oncology

## 2016-01-23 ENCOUNTER — Ambulatory Visit (HOSPITAL_BASED_OUTPATIENT_CLINIC_OR_DEPARTMENT_OTHER): Payer: Medicare Other | Admitting: Hematology and Oncology

## 2016-01-23 ENCOUNTER — Other Ambulatory Visit: Payer: Self-pay | Admitting: Hematology and Oncology

## 2016-01-23 VITALS — BP 148/76 | HR 89 | Temp 97.5°F | Resp 18 | Ht 66.0 in | Wt 186.4 lb

## 2016-01-23 DIAGNOSIS — Z85850 Personal history of malignant neoplasm of thyroid: Secondary | ICD-10-CM

## 2016-01-23 DIAGNOSIS — C801 Malignant (primary) neoplasm, unspecified: Secondary | ICD-10-CM

## 2016-01-23 DIAGNOSIS — C50211 Malignant neoplasm of upper-inner quadrant of right female breast: Secondary | ICD-10-CM | POA: Diagnosis not present

## 2016-01-23 NOTE — Progress Notes (Signed)
[facility] CONSULT NOTE  Patient Care Team: Cari Caraway, MD as PCP - General (Family Medicine)  CHIEF COMPLAINTS/PURPOSE OF CONSULTATION:  Newly diagnosed breast cancer  HISTORY OF PRESENTING ILLNESS:  Dawn Riggs 75 y.o. female is here because of recent diagnosis ofright breast cancer. She had routine surveillance mammogram that revealed abnormality in the right breast as well as Ultrasound targeted to the 10 o'clock position, 4 cm from the nipple in the right breast demonstrates a suspicious area of shadowing measuring 0.5 x 0.5 x 0.3 cm. At 9 o'clock, 3 cm from the nipple there is a hypoechoic oval mass measuring 0.7 x 0.4 x 0.3 cm. This may represent a cluster of cysts. Patient subsequently underwent right mastectomy and is here today to discuss the pathology report and to discuss adjuvant treatment options. She has recovered very well from surgery and does not have any complaints or concerns.  I reviewed her records extensively and collaborated the history with the patient.  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 Osteopenia   . Fibroids   . Complication of anesthesia   . PONV (postoperative nausea and vomiting)   . Hypothyroidism   . Cataract     bilateral- removed 02/2015  . 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 Breast cancer ([place])   . Cancer of skin of leg   . Headache     hormonal headache - now resolved    SURGICAL HISTORY: Past Surgical History  Procedure Laterality Date  . Thyroidectomy  [id number]    Dr Romelle Starcher, PA  . Laminectomy  [id number]  . Lipoma excision  08/2000    L posterior arm   . Breast surgery  Left 1997  . Skin cancer excision Right   . Cholecystectomy N/A 09/07/2013    Procedure: LAPAROSCOPIC CHOLECYSTECTOMY WITH INTRAOPERATIVE CHOLANGIOGRAM;  Surgeon: Merrie Roof, MD;  Location: [facility];  Service: General;  Laterality: N/A;  . Back surgery      L4-5  . Eye surgery      bilateral IOL, following cataracts removed   . [facility]  . [facility]lect[facility]    . Mastectomy w/ sentinel node biopsy Right 12/12/2015  . Simple mastectomy with axillary sentinel node biopsy Right 12/12/2015    Procedure: RIGHT MASTECTOMY WITH SENTINEL NODE MAPPING;  Surgeon: Autumn Messing III, MD;  Location: [facility];  Service: General;  Laterality: Right;    SOCIAL HISTORY: Social History   Social History  . Marital Status: Married    Spouse Name: N/A  . Number of Children: N/A  . Years of Education: N/A   Occupational History  . Not on file.   Social History Main Topics  . Smoking status: Never Smoker   . Smokeless tobacco: Never Used  . Alcohol Use: No  . Drug Use: No  . Sexual Activity: No   Other Topics Concern  . Not on file   Social History Narrative    FAMILY HISTORY: Family History  Problem Relation Age of Onset  . Hypertension Mother   . Stroke Mother   . Early death  Father   . Cancer Brother     lung  . Heart disease Brother   . Cancer Maternal Grandfather     non hodgkins  . Cancer Paternal Grandfather     skin  . Cancer Brother 63    leukemia    ALLERGIES:  has No Known Allergies.  MEDICATIONS:  Current Outpatient Prescriptions  Medication Sig Dispense Refill  . acetaminophen (TYLENOL) 500 MG tablet Take 1,000 mg by mouth every 6 (six) hours as needed.    . cholecalciferol (VITAMIN D) 400 UNITS TABS tablet Take 2,000 Units by mouth daily.    Marland Kitchen ibuprofen (ADVIL,MOTRIN) 200 MG tablet Take 200 mg by mouth every 6 (six) hours as needed.    Marland Kitchen levothyroxine (UNITHROID) 112 MCG tablet Take 112 mcg by mouth daily.    . Multiple  Vitamins-Minerals (CENTRUM SILVER ADULT 50+) TABS Take 1 tablet by mouth daily.      No current facility-administered medications for this visit.    REVIEW OF SYSTEMS:   Constitutional: Denies fevers, chills or abnormal night sweats Eyes: Denies blurriness of vision, double vision or watery eyes Ears, nose, mouth, throat, and face: Denies mucositis or sore throat Respiratory: Denies cough, dyspnea or wheezes Cardiovascular: Denies palpitation, chest discomfort or lower extremity swelling Gastrointestinal:  Denies nausea, heartburn or change in bowel habits Skin: Denies abnormal skin rashes Lymphatics: Denies new lymphadenopathy or easy bruising Neurological:Denies numbness, tingling or new weaknesses Behavioral/Psych: Mood is stable, no new changes  Breast:bilateral mastectomies All other systems were reviewed with the patient and are negative.  PHYSICAL EXAMINATION: ECOG PERFORMANCE STATUS: 0 - Asymptomatic  Filed Vitals:   01/23/16 1252  BP: 148/76  Pulse: 89  Temp: 97.5 F (36.4 C)  Resp: 18   Filed Weights   01/23/16 1252  Weight: 186 lb 6.4 oz (84.55 kg)    GENERAL:alert, no distress and comfortable SKIN: skin color, texture, turgor are normal, no rashes or significant lesions EYES: normal, conjunctiva are pink and non-injected, sclera clear OROPHARYNX:no exudate, no erythema and lips, buccal mucosa, and tongue normal  NECK: supple, thyroid normal size, non-tender, without nodularity LYMPH:  no palpable lymphadenopathy in the cervical, axillary or inguinal LUNGS: clear to auscultation and percussion with normal breathing effort HEART: regular rate & rhythm and no murmurs and no lower extremity edema ABDOMEN:abdomen soft, non-tender and normal bowel sounds Musculoskeletal:no cyanosis of digits and no clubbing  PSYCH: alert & oriented x 3 with fluent speech NEURO: no focal motor/sensory deficits BREAST:mastectomy scar is well-healed. No palpable axillary or  supraclavicular lymphadenopathy (exam performed in the presence of a chaperone)   LABORATORY DATA:  I have reviewed the data as listed Lab Results  Component Value Date   WBC 6.0 12/10/2015   HGB 14.9 12/10/2015   HCT 44.7 12/10/2015   MCV 87.0 12/10/2015   PLT 248 12/10/2015   Lab Results  Component Value Date   NA 138 12/10/2015   K 4.2 12/10/2015   CL 104 12/10/2015   CO2 29 12/10/2015    RADIOGRAPHIC STUDIES: I have personally reviewed the radiological reports and agreed with the findings in the report.  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 I discussed the margins as well as lymph node surgeries. We also discussed the final staging along with previously performed ER/PR and HER-2/neu testing.  Recommendation: 1. I discussed the pros and cons of doing Oncotype DX testing but at the end of the discussion we elected not to do the test. She is not interested in systemic chemotherapy. I also feel that based upon very good prognostic panel clinically her disease appears to be low risk. 2. I recommended adjuvant antiestrogen therapy with anastrozole 1 mg daily 5 years and we discussed the risks and benefits of this treatment including the risk of hot flashes, myalgias, risk of osteoporosis. Patient is going to think about it and let inform us of her decision.  Given her personal history of medullary thyroid cancer, I would like to discuss with our genetic counselor to see if she would benefit from genetic counseling.  Plan: 1. If she does not go on antiestrogen therapy, she may be followed by  Dr. Marlou Starks and her primary care physician for surveillance purposes. We are happy to see her on an as-needed basis.  2. If she does want to go on antiestrogen therapy, I will need to see her every 6 months for 2 years than once a year thereafter until year 5.   All questions were answered. The patient knows to call the clinic with any problems, questions or concerns.    Rulon Eisenmenger, MD 01/23/2016

## 2016-01-23 NOTE — Assessment & Plan Note (Addendum)
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 I discussed the margins as well as lymph node surgeries. We also discussed the final staging along with previously performed ER/PR and HER-2/neu testing.  Recommendation: 1. I discussed the pros and cons of doing Oncotype DX testing but at the end of the discussion we elected not to do the test. She is not interested in systemic chemotherapy. I also feel that based upon very good prognostic panel clinically her disease appears to be low risk. 2. I recommended adjuvant antiestrogen therapy with anastrozole 1 mg daily 5 years and we discussed the risks and benefits of this treatment including the risk of hot flashes, myalgias, risk of osteoporosis. Patient is going to think about it and let inform us of her decision.  If she does not go on antiestrogen therapy, she may be followed by Dr. Marlou Starks and her primary care physician for surveillance purposes. We are happy to see her on an as-needed basis.  If she does want to go on antiestrogen therapy, I will need to see her every 6 months for 2 years than once a year thereafter until year 5.

## 2016-01-28 ENCOUNTER — Other Ambulatory Visit: Payer: Self-pay | Admitting: *Deleted

## 2016-01-28 ENCOUNTER — Telehealth: Payer: Self-pay | Admitting: Hematology and Oncology

## 2016-01-28 NOTE — Telephone Encounter (Signed)
Left message for patient re appointment for 2/20 (genetics). No other orders per 2/2 pof. Schedule mailed.

## 2016-01-29 ENCOUNTER — Telehealth: Payer: Self-pay | Admitting: Genetic Counselor

## 2016-01-29 NOTE — Telephone Encounter (Signed)
Per pof genetics appt has been cancelled

## 2016-02-10 ENCOUNTER — Encounter: Payer: Medicare Other | Admitting: Genetic Counselor

## 2016-02-10 ENCOUNTER — Other Ambulatory Visit: Payer: Medicare Other

## 2016-02-19 DIAGNOSIS — L900 Lichen sclerosus et atrophicus: Secondary | ICD-10-CM | POA: Diagnosis not present

## 2016-02-19 DIAGNOSIS — C50911 Malignant neoplasm of unspecified site of right female breast: Secondary | ICD-10-CM | POA: Diagnosis not present

## 2016-02-24 ENCOUNTER — Other Ambulatory Visit: Payer: Self-pay | Admitting: *Deleted

## 2016-02-24 DIAGNOSIS — C50211 Malignant neoplasm of upper-inner quadrant of right female breast: Secondary | ICD-10-CM

## 2016-02-25 DIAGNOSIS — M5416 Radiculopathy, lumbar region: Secondary | ICD-10-CM | POA: Diagnosis not present

## 2016-02-25 DIAGNOSIS — M545 Low back pain: Secondary | ICD-10-CM | POA: Diagnosis not present

## 2016-03-10 ENCOUNTER — Encounter: Payer: Self-pay | Admitting: *Deleted

## 2016-03-10 ENCOUNTER — Other Ambulatory Visit: Payer: Self-pay | Admitting: *Deleted

## 2016-03-10 ENCOUNTER — Telehealth: Payer: Self-pay | Admitting: Hematology and Oncology

## 2016-03-10 DIAGNOSIS — M5417 Radiculopathy, lumbosacral region: Secondary | ICD-10-CM | POA: Diagnosis not present

## 2016-03-10 DIAGNOSIS — C50211 Malignant neoplasm of upper-inner quadrant of right female breast: Secondary | ICD-10-CM

## 2016-03-10 NOTE — Progress Notes (Signed)
Spoke to pt concerning AI therapy. Pt relate she is not going to take anti-estrogen oral therapy. Discussed survivorship program. Pt wishes that she receive the care plan in the mail. Encourage pt to call with further needs or questions. Received verbal understanding.

## 2016-03-10 NOTE — Telephone Encounter (Signed)
Per 2nd 3/21 pof cxs survivorship clinic referral for SCP visit - no other orders per 3/21 pof's.

## 2016-03-10 NOTE — Progress Notes (Signed)
Left vm for pt to return call to discuss plan for AI and to discuss survivorship program. Contact information provided. Referral placed for survivorship.

## 2016-03-12 ENCOUNTER — Ambulatory Visit
Admission: RE | Admit: 2016-03-12 | Discharge: 2016-03-12 | Disposition: A | Discharge status: Home / Self Care | Payer: Medicare Other | Source: Ambulatory Visit | Attending: Family Medicine | Admitting: Family Medicine

## 2016-03-12 ENCOUNTER — Other Ambulatory Visit: Payer: Self-pay | Admitting: Family Medicine

## 2016-03-12 DIAGNOSIS — M25551 Pain in right hip: Secondary | ICD-10-CM | POA: Diagnosis not present

## 2016-03-12 DIAGNOSIS — M5440 Lumbago with sciatica, unspecified side: Secondary | ICD-10-CM

## 2016-03-12 DIAGNOSIS — M5136 Other intervertebral disc degeneration, lumbar region: Secondary | ICD-10-CM | POA: Diagnosis not present

## 2016-03-12 DIAGNOSIS — M25552 Pain in left hip: Secondary | ICD-10-CM | POA: Diagnosis not present

## 2016-03-24 DIAGNOSIS — M25551 Pain in right hip: Secondary | ICD-10-CM | POA: Diagnosis not present

## 2016-03-24 DIAGNOSIS — M25552 Pain in left hip: Secondary | ICD-10-CM | POA: Diagnosis not present

## 2016-03-26 ENCOUNTER — Encounter: Payer: Medicare Other | Admitting: Nurse Practitioner

## 2016-04-03 ENCOUNTER — Encounter: Payer: Self-pay | Admitting: Nurse Practitioner

## 2016-04-03 DIAGNOSIS — C50211 Malignant neoplasm of upper-inner quadrant of right female breast: Secondary | ICD-10-CM

## 2016-04-03 NOTE — Progress Notes (Unsigned)
The Survivorship Care Plan was mailed to Dawn Riggs as she reported not being able to come in to the Survivorship Clinic for an in-person visit at this time. A letter was mailed to her outlining the purpose of the content of the care plan, as well as encouraging her to reach out to me with any questions or concerns.  My business card was included in the correspondence to the patient as well.  A copy of the care plan was also routed/faxed/mailed to [facility], MD, the patient's PCP.  I will not be placing any follow-up appointments to the Survivorship Clinic for Dawn Riggs, but I am happy to see her at any time in the future for any survivorship concerns that may arise. Thank you for allowing me to participate in her care!  Kenn File, [place] [phone]

## 2016-04-08 DIAGNOSIS — M25552 Pain in left hip: Secondary | ICD-10-CM | POA: Diagnosis not present

## 2016-04-08 DIAGNOSIS — M25551 Pain in right hip: Secondary | ICD-10-CM | POA: Diagnosis not present

## 2016-04-10 DIAGNOSIS — M25552 Pain in left hip: Secondary | ICD-10-CM | POA: Diagnosis not present

## 2016-04-10 DIAGNOSIS — M25551 Pain in right hip: Secondary | ICD-10-CM | POA: Diagnosis not present

## 2016-04-14 DIAGNOSIS — M25552 Pain in left hip: Secondary | ICD-10-CM | POA: Diagnosis not present

## 2016-04-14 DIAGNOSIS — M25551 Pain in right hip: Secondary | ICD-10-CM | POA: Diagnosis not present

## 2016-04-17 DIAGNOSIS — M25551 Pain in right hip: Secondary | ICD-10-CM | POA: Diagnosis not present

## 2016-04-17 DIAGNOSIS — M25552 Pain in left hip: Secondary | ICD-10-CM | POA: Diagnosis not present

## 2016-04-21 DIAGNOSIS — M25551 Pain in right hip: Secondary | ICD-10-CM | POA: Diagnosis not present

## 2016-04-21 DIAGNOSIS — M25552 Pain in left hip: Secondary | ICD-10-CM | POA: Diagnosis not present

## 2016-04-23 DIAGNOSIS — M25552 Pain in left hip: Secondary | ICD-10-CM | POA: Diagnosis not present

## 2016-04-23 DIAGNOSIS — M25551 Pain in right hip: Secondary | ICD-10-CM | POA: Diagnosis not present

## 2016-04-28 DIAGNOSIS — M25552 Pain in left hip: Secondary | ICD-10-CM | POA: Diagnosis not present

## 2016-04-28 DIAGNOSIS — M25551 Pain in right hip: Secondary | ICD-10-CM | POA: Diagnosis not present

## 2016-04-30 DIAGNOSIS — M25551 Pain in right hip: Secondary | ICD-10-CM | POA: Diagnosis not present

## 2016-04-30 DIAGNOSIS — M25552 Pain in left hip: Secondary | ICD-10-CM | POA: Diagnosis not present

## 2016-05-05 DIAGNOSIS — M25552 Pain in left hip: Secondary | ICD-10-CM | POA: Diagnosis not present

## 2016-05-05 DIAGNOSIS — M25551 Pain in right hip: Secondary | ICD-10-CM | POA: Diagnosis not present

## 2016-05-07 DIAGNOSIS — M25551 Pain in right hip: Secondary | ICD-10-CM | POA: Diagnosis not present

## 2016-05-07 DIAGNOSIS — M25552 Pain in left hip: Secondary | ICD-10-CM | POA: Diagnosis not present

## 2016-07-28 DIAGNOSIS — C50411 Malignant neoplasm of upper-outer quadrant of right female breast: Secondary | ICD-10-CM | POA: Diagnosis not present

## 2016-07-29 DIAGNOSIS — H2513 Age-related nuclear cataract, bilateral: Secondary | ICD-10-CM | POA: Diagnosis not present

## 2016-09-21 DIAGNOSIS — Z23 Encounter for immunization: Secondary | ICD-10-CM | POA: Diagnosis not present

## 2016-11-02 DIAGNOSIS — L900 Lichen sclerosus et atrophicus: Secondary | ICD-10-CM | POA: Diagnosis not present

## 2016-11-02 DIAGNOSIS — Z01419 Encounter for gynecological examination (general) (routine) without abnormal findings: Secondary | ICD-10-CM | POA: Diagnosis not present

## 2016-11-02 DIAGNOSIS — Z6830 Body mass index (BMI) 30.0-30.9, adult: Secondary | ICD-10-CM | POA: Diagnosis not present

## 2016-11-24 DIAGNOSIS — L820 Inflamed seborrheic keratosis: Secondary | ICD-10-CM | POA: Diagnosis not present

## 2016-12-01 DIAGNOSIS — Z853 Personal history of malignant neoplasm of breast: Secondary | ICD-10-CM | POA: Diagnosis not present

## 2016-12-01 DIAGNOSIS — E559 Vitamin D deficiency, unspecified: Secondary | ICD-10-CM | POA: Diagnosis not present

## 2016-12-01 DIAGNOSIS — Z1389 Encounter for screening for other disorder: Secondary | ICD-10-CM | POA: Diagnosis not present

## 2016-12-01 DIAGNOSIS — E039 Hypothyroidism, unspecified: Secondary | ICD-10-CM | POA: Diagnosis not present

## 2016-12-01 DIAGNOSIS — Z86010 Personal history of colonic polyps: Secondary | ICD-10-CM | POA: Diagnosis not present

## 2016-12-01 DIAGNOSIS — Z23 Encounter for immunization: Secondary | ICD-10-CM | POA: Diagnosis not present

## 2016-12-01 DIAGNOSIS — Z85850 Personal history of malignant neoplasm of thyroid: Secondary | ICD-10-CM | POA: Diagnosis not present

## 2016-12-08 DIAGNOSIS — L820 Inflamed seborrheic keratosis: Secondary | ICD-10-CM | POA: Diagnosis not present

## 2016-12-23 DIAGNOSIS — C50911 Malignant neoplasm of unspecified site of right female breast: Secondary | ICD-10-CM | POA: Diagnosis not present

## 2016-12-23 DIAGNOSIS — M8580 Other specified disorders of bone density and structure, unspecified site: Secondary | ICD-10-CM | POA: Diagnosis not present

## 2016-12-23 DIAGNOSIS — Z853 Personal history of malignant neoplasm of breast: Secondary | ICD-10-CM | POA: Diagnosis not present

## 2016-12-23 DIAGNOSIS — Z1389 Encounter for screening for other disorder: Secondary | ICD-10-CM | POA: Diagnosis not present

## 2016-12-23 DIAGNOSIS — E559 Vitamin D deficiency, unspecified: Secondary | ICD-10-CM | POA: Diagnosis not present

## 2016-12-23 DIAGNOSIS — E039 Hypothyroidism, unspecified: Secondary | ICD-10-CM | POA: Diagnosis not present

## 2017-01-11 DIAGNOSIS — J0100 Acute maxillary sinusitis, unspecified: Secondary | ICD-10-CM | POA: Diagnosis not present

## 2017-02-16 DIAGNOSIS — E039 Hypothyroidism, unspecified: Secondary | ICD-10-CM | POA: Diagnosis not present

## 2017-03-30 IMAGING — US US SOFT TISSUE HEAD/NECK
1 series · 14 of 14 positions shown · non-contrast
Comparison: None available

CLINICAL DATA: Previous thyroidectomy

EXAM:
THYROID ULTRASOUND
TECHNIQUE: Ultrasound examination of the thyroid gland and adjacent soft
tissues was performed.

[Series 1: us soft tissue head/neck · 0.07mm/px · 14 of 14 slices shown]
[im 1/14]
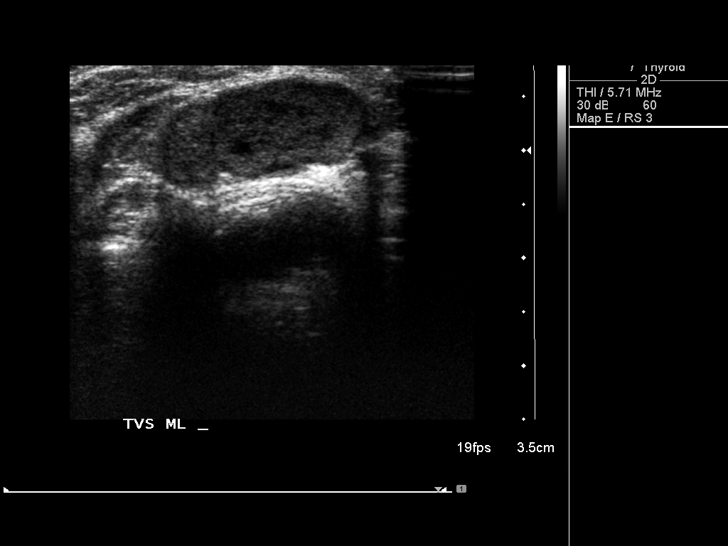
[im 2/14]
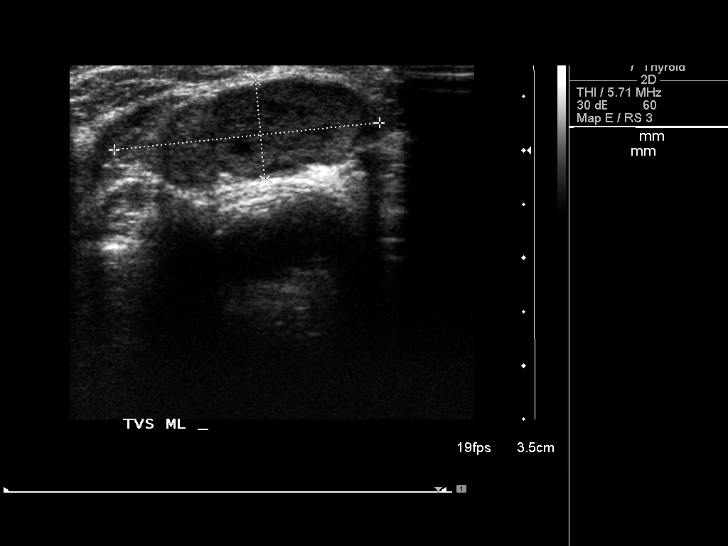
[im 3/14]
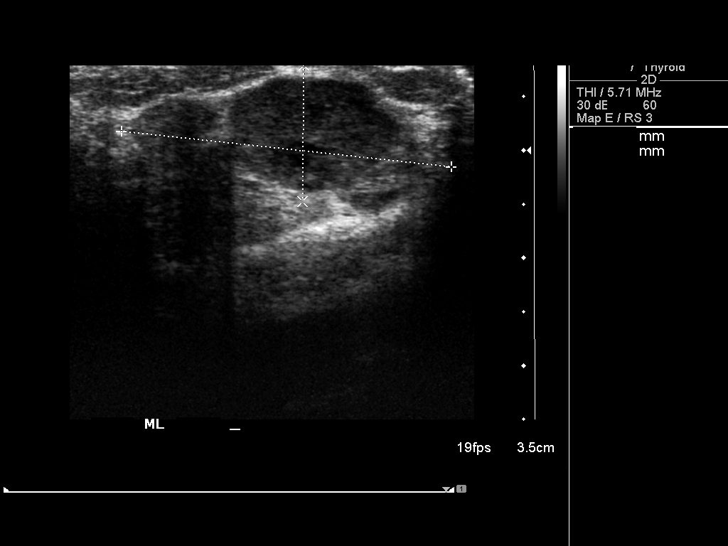
[im 4/14]
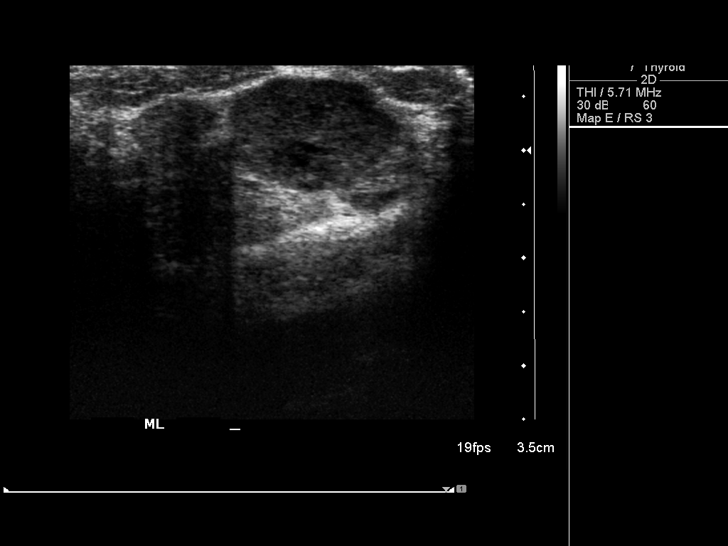
[im 5/14]
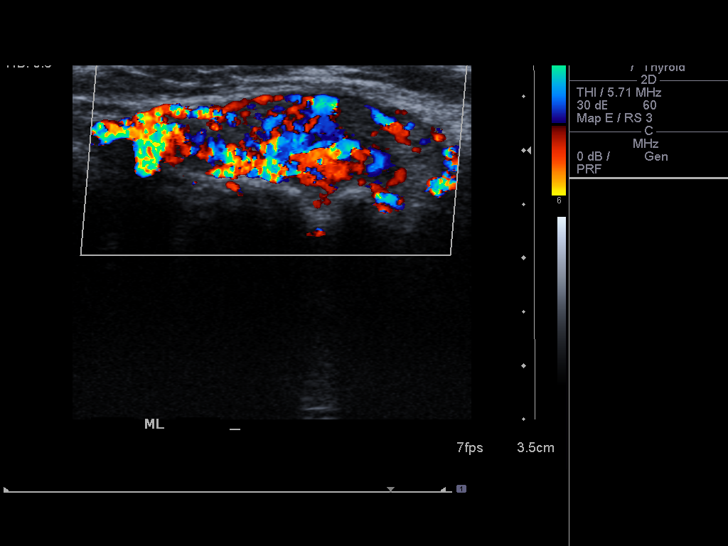
[im 6/14]
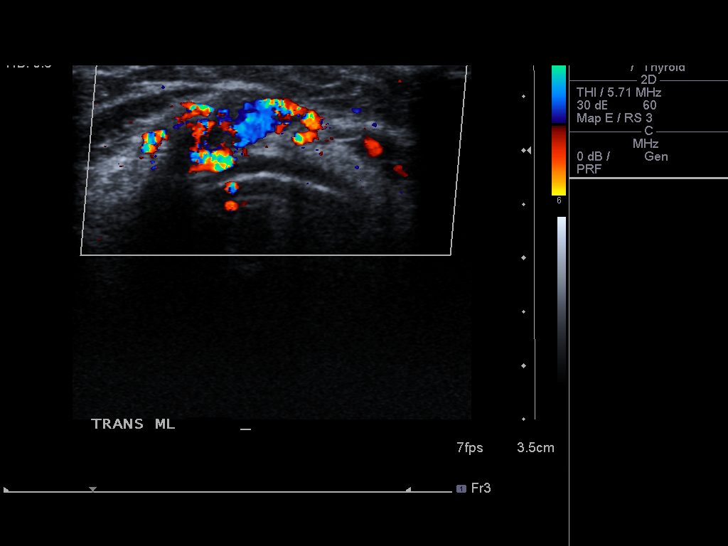
[im 7/14]
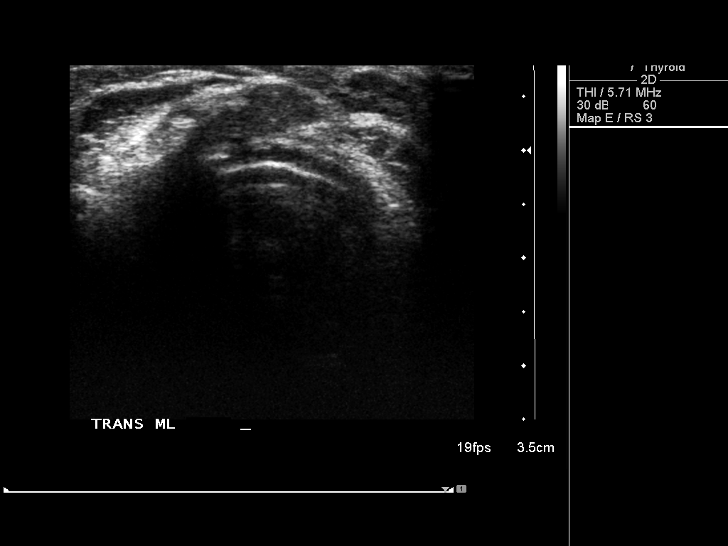
[im 8/14]
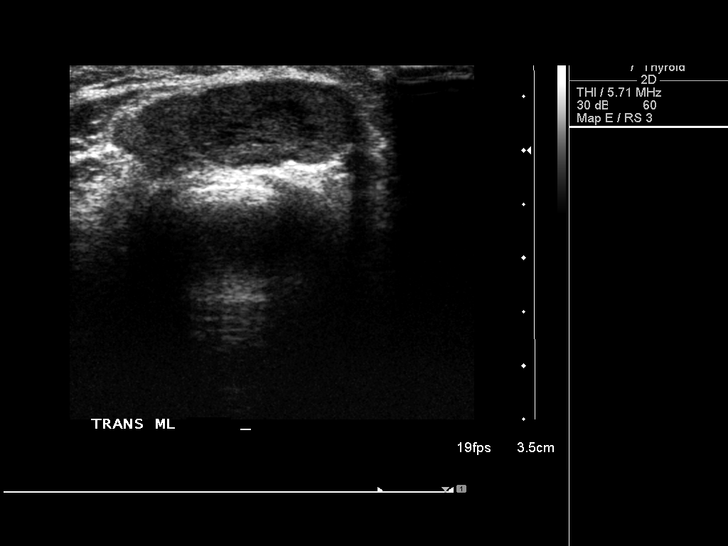
[im 9/14]
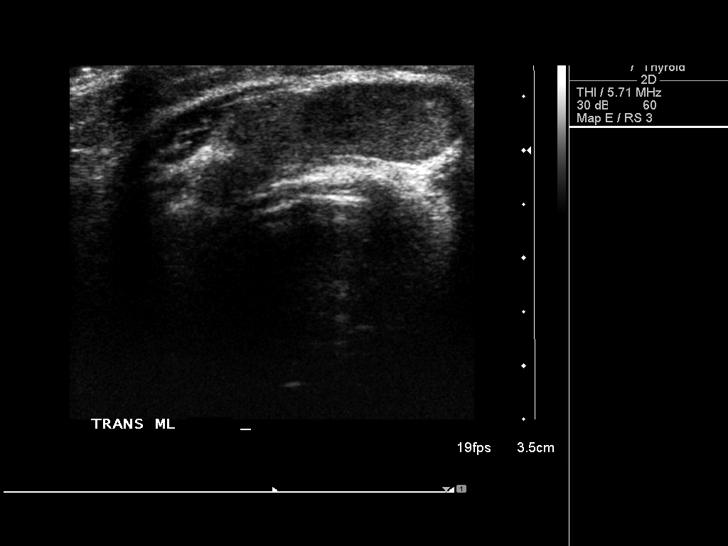
[im 10/14]
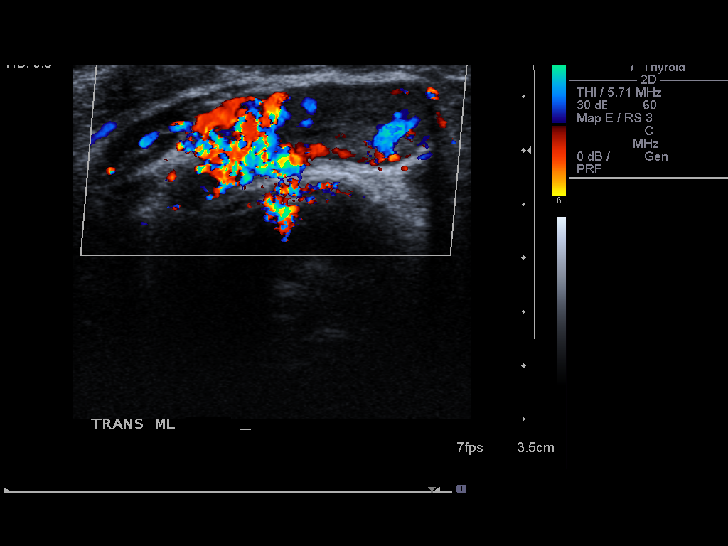
[im 11/14]
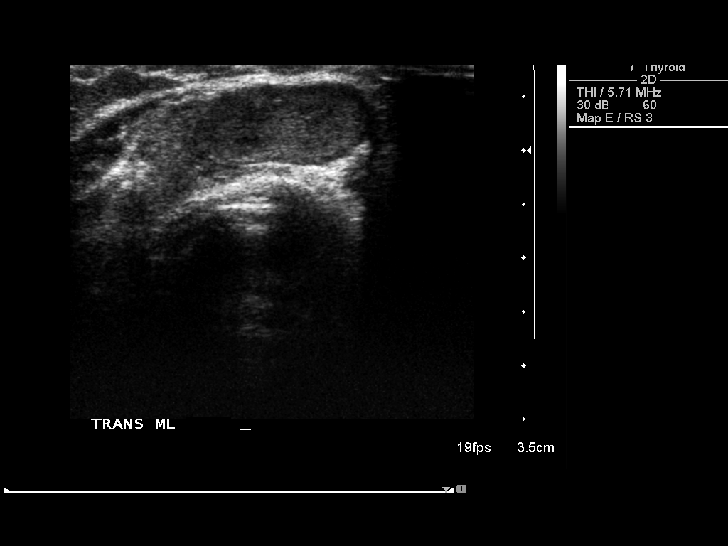
[im 12/14]
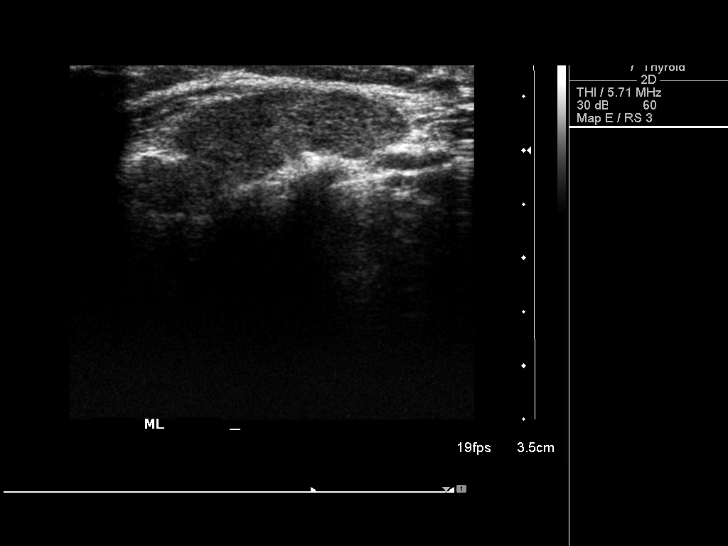
[im 13/14]
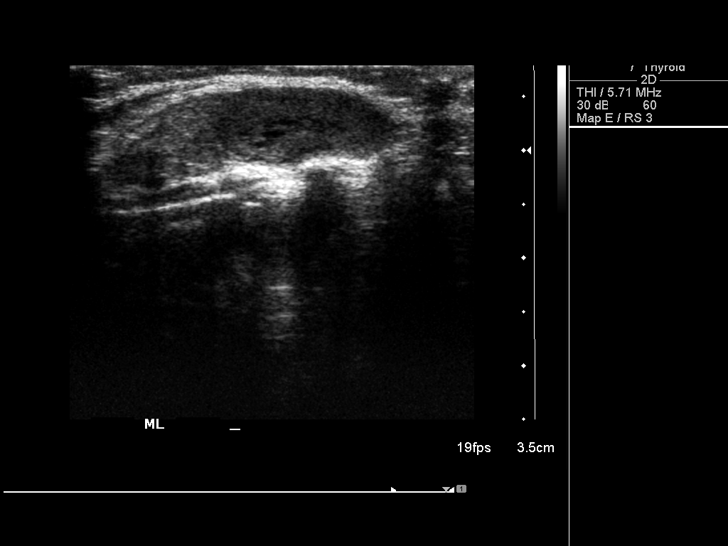
[im 14/14]
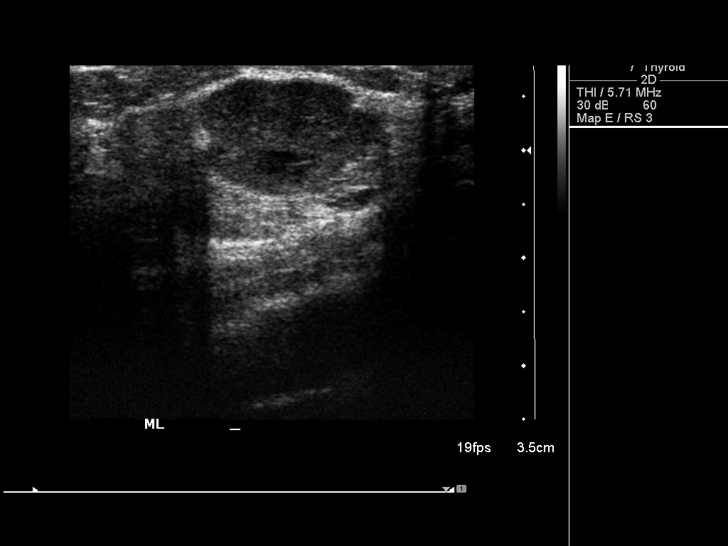

[14 of 14 positions shown; findings below may reference images not displayed]

FINDINGS: Right thyroid lobe

Surgically absent.    No nodules visualized.

Left thyroid lobe

Surgically absent.  No nodules visualized.

Isthmus

Surgically absent. There is a solid hyperemic midline mass 31 x 13 x
25 mm.

Lymphadenopathy

None visualized.
IMPRESSION: 1. 3.1 cm residual/recurrent midline nodule post thyroidectomy.
Ultrasound-guided fine needle aspiration should be considered.

## 2017-06-01 DIAGNOSIS — C50411 Malignant neoplasm of upper-outer quadrant of right female breast: Secondary | ICD-10-CM | POA: Diagnosis not present

## 2017-06-28 DIAGNOSIS — M25561 Pain in right knee: Secondary | ICD-10-CM | POA: Diagnosis not present

## 2017-09-02 DIAGNOSIS — E039 Hypothyroidism, unspecified: Secondary | ICD-10-CM | POA: Diagnosis not present

## 2017-09-02 DIAGNOSIS — Z23 Encounter for immunization: Secondary | ICD-10-CM | POA: Diagnosis not present

## 2017-11-26 DIAGNOSIS — C50911 Malignant neoplasm of unspecified site of right female breast: Secondary | ICD-10-CM | POA: Diagnosis not present

## 2017-11-26 DIAGNOSIS — L900 Lichen sclerosus et atrophicus: Secondary | ICD-10-CM | POA: Diagnosis not present

## 2017-11-26 DIAGNOSIS — Z6830 Body mass index (BMI) 30.0-30.9, adult: Secondary | ICD-10-CM | POA: Diagnosis not present

## 2017-11-26 DIAGNOSIS — M8580 Other specified disorders of bone density and structure, unspecified site: Secondary | ICD-10-CM | POA: Diagnosis not present

## 2017-11-26 DIAGNOSIS — Z01419 Encounter for gynecological examination (general) (routine) without abnormal findings: Secondary | ICD-10-CM | POA: Diagnosis not present

## 2017-12-06 DIAGNOSIS — M8589 Other specified disorders of bone density and structure, multiple sites: Secondary | ICD-10-CM | POA: Diagnosis not present

## 2017-12-06 DIAGNOSIS — M8580 Other specified disorders of bone density and structure, unspecified site: Secondary | ICD-10-CM | POA: Diagnosis not present

## 2018-02-14 DIAGNOSIS — E039 Hypothyroidism, unspecified: Secondary | ICD-10-CM | POA: Diagnosis not present

## 2018-02-17 DIAGNOSIS — L659 Nonscarring hair loss, unspecified: Secondary | ICD-10-CM | POA: Diagnosis not present

## 2018-02-17 DIAGNOSIS — Z853 Personal history of malignant neoplasm of breast: Secondary | ICD-10-CM | POA: Diagnosis not present

## 2018-02-17 DIAGNOSIS — M8580 Other specified disorders of bone density and structure, unspecified site: Secondary | ICD-10-CM | POA: Diagnosis not present

## 2018-02-17 DIAGNOSIS — Z85850 Personal history of malignant neoplasm of thyroid: Secondary | ICD-10-CM | POA: Diagnosis not present

## 2018-02-17 DIAGNOSIS — M25561 Pain in right knee: Secondary | ICD-10-CM | POA: Diagnosis not present

## 2018-02-17 DIAGNOSIS — E039 Hypothyroidism, unspecified: Secondary | ICD-10-CM | POA: Diagnosis not present

## 2018-02-17 DIAGNOSIS — Z131 Encounter for screening for diabetes mellitus: Secondary | ICD-10-CM | POA: Diagnosis not present

## 2018-02-25 DIAGNOSIS — M25561 Pain in right knee: Secondary | ICD-10-CM | POA: Diagnosis not present

## 2018-02-28 DIAGNOSIS — M25561 Pain in right knee: Secondary | ICD-10-CM | POA: Diagnosis not present

## 2018-03-02 DIAGNOSIS — M25561 Pain in right knee: Secondary | ICD-10-CM | POA: Diagnosis not present

## 2018-03-08 DIAGNOSIS — M25561 Pain in right knee: Secondary | ICD-10-CM | POA: Diagnosis not present

## 2018-03-11 DIAGNOSIS — M25561 Pain in right knee: Secondary | ICD-10-CM | POA: Diagnosis not present

## 2018-03-14 DIAGNOSIS — M25561 Pain in right knee: Secondary | ICD-10-CM | POA: Diagnosis not present

## 2018-03-17 DIAGNOSIS — M25561 Pain in right knee: Secondary | ICD-10-CM | POA: Diagnosis not present

## 2018-03-21 DIAGNOSIS — M25561 Pain in right knee: Secondary | ICD-10-CM | POA: Diagnosis not present

## 2018-03-23 DIAGNOSIS — M25561 Pain in right knee: Secondary | ICD-10-CM | POA: Diagnosis not present

## 2018-07-21 DIAGNOSIS — C50411 Malignant neoplasm of upper-outer quadrant of right female breast: Secondary | ICD-10-CM | POA: Diagnosis not present

## 2018-08-23 DIAGNOSIS — E039 Hypothyroidism, unspecified: Secondary | ICD-10-CM | POA: Diagnosis not present

## 2018-08-23 DIAGNOSIS — Z131 Encounter for screening for diabetes mellitus: Secondary | ICD-10-CM | POA: Diagnosis not present

## 2018-08-23 DIAGNOSIS — C50911 Malignant neoplasm of unspecified site of right female breast: Secondary | ICD-10-CM | POA: Diagnosis not present

## 2018-08-23 DIAGNOSIS — E559 Vitamin D deficiency, unspecified: Secondary | ICD-10-CM | POA: Diagnosis not present

## 2018-08-23 DIAGNOSIS — Z853 Personal history of malignant neoplasm of breast: Secondary | ICD-10-CM | POA: Diagnosis not present

## 2018-08-23 DIAGNOSIS — Z85850 Personal history of malignant neoplasm of thyroid: Secondary | ICD-10-CM | POA: Diagnosis not present

## 2018-09-02 DIAGNOSIS — Z23 Encounter for immunization: Secondary | ICD-10-CM | POA: Diagnosis not present

## 2018-10-29 DIAGNOSIS — R51 Headache: Secondary | ICD-10-CM | POA: Diagnosis not present

## 2018-10-29 DIAGNOSIS — J0100 Acute maxillary sinusitis, unspecified: Secondary | ICD-10-CM | POA: Diagnosis not present

## 2018-11-01 DIAGNOSIS — J0140 Acute pansinusitis, unspecified: Secondary | ICD-10-CM | POA: Diagnosis not present

## 2018-11-07 DIAGNOSIS — R030 Elevated blood-pressure reading, without diagnosis of hypertension: Secondary | ICD-10-CM | POA: Diagnosis not present

## 2018-11-07 DIAGNOSIS — H6590 Unspecified nonsuppurative otitis media, unspecified ear: Secondary | ICD-10-CM | POA: Diagnosis not present

## 2018-11-10 ENCOUNTER — Emergency Department (HOSPITAL_COMMUNITY): Payer: Medicare Other

## 2018-11-10 ENCOUNTER — Other Ambulatory Visit: Payer: Self-pay

## 2018-11-10 ENCOUNTER — Emergency Department (HOSPITAL_COMMUNITY)
Admission: EM | Admit: 2018-11-10 | Discharge: 2018-11-10 | Disposition: A | Discharge status: Home / Self Care | Payer: Medicare Other | Attending: Emergency Medicine | Admitting: Emergency Medicine

## 2018-11-10 ENCOUNTER — Encounter (HOSPITAL_COMMUNITY): Payer: Self-pay

## 2018-11-10 DIAGNOSIS — Z85850 Personal history of malignant neoplasm of thyroid: Secondary | ICD-10-CM | POA: Diagnosis not present

## 2018-11-10 DIAGNOSIS — Z79899 Other long term (current) drug therapy: Secondary | ICD-10-CM | POA: Diagnosis not present

## 2018-11-10 DIAGNOSIS — Z9049 Acquired absence of other specified parts of digestive tract: Secondary | ICD-10-CM | POA: Diagnosis not present

## 2018-11-10 DIAGNOSIS — Z85828 Personal history of other malignant neoplasm of skin: Secondary | ICD-10-CM | POA: Diagnosis not present

## 2018-11-10 DIAGNOSIS — R519 Headache, unspecified: Secondary | ICD-10-CM

## 2018-11-10 DIAGNOSIS — R51 Headache: Secondary | ICD-10-CM | POA: Diagnosis not present

## 2018-11-10 DIAGNOSIS — R42 Dizziness and giddiness: Secondary | ICD-10-CM | POA: Diagnosis not present

## 2018-11-10 DIAGNOSIS — Z853 Personal history of malignant neoplasm of breast: Secondary | ICD-10-CM | POA: Diagnosis not present

## 2018-11-10 DIAGNOSIS — E039 Hypothyroidism, unspecified: Secondary | ICD-10-CM | POA: Insufficient documentation

## 2018-11-10 LAB — CBC WITH DIFFERENTIAL/PLATELET
Abs Immature Granulocytes: 0.06 10*3/uL (ref 0.00–0.07)
Basophils Absolute: 0 10*3/uL (ref 0.0–0.1)
Basophils Relative: 0 %
Eosinophils Absolute: 0 10*3/uL (ref 0.0–0.5)
Eosinophils Relative: 0 %
HCT: 46.1 % — ABNORMAL HIGH (ref 36.0–46.0)
Hemoglobin: 15.2 g/dL — ABNORMAL HIGH (ref 12.0–15.0)
Immature Granulocytes: 1 %
Lymphocytes Relative: 15 %
Lymphs Abs: 1.2 10*3/uL (ref 0.7–4.0)
MCH: 28.3 pg (ref 26.0–34.0)
MCHC: 33 g/dL (ref 30.0–36.0)
MCV: 85.8 fL (ref 80.0–100.0)
Monocytes Absolute: 0.6 10*3/uL (ref 0.1–1.0)
Monocytes Relative: 8 %
Neutro Abs: 6.1 10*3/uL (ref 1.7–7.7)
Neutrophils Relative %: 76 %
Platelets: 312 10*3/uL (ref 150–400)
RBC: 5.37 MIL/uL — ABNORMAL HIGH (ref 3.87–5.11)
RDW: 13.1 % (ref 11.5–15.5)
WBC: 8.1 10*3/uL (ref 4.0–10.5)
nRBC: 0 % (ref 0.0–0.2)

## 2018-11-10 LAB — BASIC METABOLIC PANEL
Anion gap: 9 (ref 5–15)
BUN: 14 mg/dL (ref 8–23)
CO2: 22 mmol/L (ref 22–32)
Calcium: 9.2 mg/dL (ref 8.9–10.3)
Chloride: 99 mmol/L (ref 98–111)
Creatinine, Ser: 0.79 mg/dL (ref 0.44–1.00)
GFR calc Af Amer: >60 mL/min (ref >60)
GFR calc non Af Amer: >60 mL/min (ref >60)
Glucose, Bld: 102 mg/dL — ABNORMAL HIGH (ref 70–99)
Potassium: 3.9 mmol/L (ref 3.5–5.1)
Sodium: 130 mmol/L — ABNORMAL LOW (ref 135–145)

## 2018-11-10 MED ORDER — METOCLOPRAMIDE HCL 5 MG/ML IJ SOLN
10.0000 mg | Freq: Once | INTRAMUSCULAR | Status: AC
  Administered 2018-11-10: 10 mg via INTRAVENOUS
  Filled 2018-11-10: qty 2

## 2018-11-10 MED ORDER — SODIUM CHLORIDE 0.9 % IV BOLUS
500.0000 mL | Freq: Once | INTRAVENOUS | Status: AC
  Administered 2018-11-10: 500 mL via INTRAVENOUS

## 2018-11-10 MED ORDER — KETOROLAC TROMETHAMINE 30 MG/ML IJ SOLN
15.0000 mg | Freq: Once | INTRAMUSCULAR | Status: AC
  Administered 2018-11-10: 15 mg via INTRAVENOUS
  Filled 2018-11-10: qty 1

## 2018-11-10 NOTE — Discharge Instructions (Addendum)
Please follow-up with your doctor for further evaluation and treatment of your ongoing headaches.  You may need to see a neurologist.  Please return the emergency department if you develop any new or worsening symptoms.

## 2018-11-10 NOTE — ED Provider Notes (Signed)
[facility] EMERGENCY DEPARTMENT Provider Note   CSN: [id number] Arrival date & time: 11/10/18  1302     History   Chief Complaint Chief Complaint  Patient presents with  . Headache    HPI Dawn Riggs is a 77 y.o. female with history of hypothyroidism, breast cancer who presents with a 2-week history of ongoing headache.  She describes her headache as pressure and throbbing in her frontal region and temples.  Radiates toward her ears.  She is seeing her doctor's office 3 times for this and has tried prednisone, amoxicillin, Mucinex.  She reports all have been minimally helpful.  She denies any vision changes.  She has had some intermittent episodes of dizziness.  Patient reports a history of migraine headaches prior to going through menopause, but rarely gets them severely now.  She does report headaches similar to migraines with a preceding aura twice in the past couple weeks.  However, she continues to have persistent pressure.  She denies any numbness or tingling or weakness, fever, neck pain, chest pain, shortness of breath, abdominal pain, nausea, vomiting, urinary symptoms.  HPI  Past Medical History:  Diagnosis Date  . Breast cancer ([place])   . Cancer of skin of leg   . Cataract    bilateral- removed 02/2015  . Complication of anesthesia   . Fibroids   . Headache    hormonal headache - now resolved  . Hypothyroidism   . 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 Osteopenia   . PONV (postoperative nausea and vomiting)     Patient Active Problem List   Diagnosis Date Noted  . 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 Gallstones 08/24/2013  . HX: breast cancer 10/21/2012  . 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 Osteopenia   . Fibroids   . Cancer of skin of leg     Past Surgical History:  Procedure Laterality Date  . BACK SURGERY     L4-5  . BREAST SURGERY Left 1997  . CHOLECYSTECTOMY N/A 09/07/2013    Procedure: LAPAROSCOPIC CHOLECYSTECTOMY WITH INTRAOPERATIVE CHOLANGIOGRAM;  Surgeon: Merrie Roof, MD;  Location: [facility];  Service: General;  Laterality: N/A;  . EYE SURGERY     bilateral IOL, following cataracts removed   . LAMINECTOMY  [id number]  . LIPOMA EXCISION  08/2000   L posterior arm   . [facility]  . MASTECTOMY W/ SENTINEL NODE BIOPSY Right 12/12/2015  . SIMPLE MASTECTOMY WITH AXILLARY SENTINEL NODE BIOPSY Right 12/12/2015   Procedure: RIGHT MASTECTOMY WITH SENTINEL NODE MAPPING;  Surgeon: Autumn Messing III, MD;  Location: [facility];  Service: General;  Laterality: Right;  . SKIN CANCER EXCISION Right   . THYROIDECTOMY  [id number]   Dr Romelle Starcher, PA  . TONSILLECTOMY       OB History    Gravida  2   Para  2   Term      Preterm      AB      Living  1     SAB      TAB      Ectopic      Multiple      Live Births               Home Medications    Prior to Admission medications   Medication Sig Start Date End Date Taking? Authorizing Provider  acetaminophen (  TYLENOL) 500 MG tablet Take 1,000 mg by mouth every 6 (six) hours as needed.    [provider]  cholecalciferol (VITAMIN D) 400 UNITS TABS tablet Take 2,000 Units by mouth daily.    [provider]  ibuprofen (ADVIL,MOTRIN) 200 MG tablet Take 200 mg by mouth every 6 (six) hours as needed.    [provider]  levothyroxine (UNITHROID) 112 MCG tablet Take 112 mcg by mouth daily.    [provider]  Multiple Vitamins-Minerals (CENTRUM SILVER ADULT 50+) TABS Take 1 tablet by mouth daily.     [provider]    Family History Family History  Problem Relation Age of Onset  . Hypertension Mother   . Stroke Mother   . Early death Father   . Cancer Brother        lung  . Heart disease Brother   . Cancer Maternal Grandfather        non hodgkins  . Cancer Paternal Grandfather        skin  . Cancer Brother 76        leukemia    Social History Social History   Tobacco Use  . Smoking status: Never Smoker  . Smokeless tobacco: Never Used  Substance Use Topics  . Alcohol use: No  . Drug use: No     Allergies   Patient has no known allergies.   Review of Systems Review of Systems  Constitutional: Negative for chills and fever.  HENT: Positive for ear pain. Negative for congestion, facial swelling and sore throat.   Respiratory: Negative for shortness of breath.   Cardiovascular: Negative for chest pain.  Gastrointestinal: Negative for abdominal pain, nausea and vomiting.  Genitourinary: Negative for dysuria.  Musculoskeletal: Negative for back pain.  Skin: Negative for rash and wound.  Neurological: Positive for dizziness and headaches. Negative for weakness and numbness.  Psychiatric/Behavioral: The patient is not nervous/anxious.      Physical Exam Updated Vital Signs BP (!) 156/84 (BP Location: Right Arm)   Pulse 86   Temp 98.4 F (36.9 C) (Oral)   Resp 16   LMP 12/22/1995   SpO2 98%   Physical Exam  Constitutional: She appears well-developed and well-nourished. No distress.  HENT:  Head: Normocephalic and atraumatic.  Mouth/Throat: Oropharynx is clear and moist. No oropharyngeal exudate.  Eyes: Pupils are equal, round, and reactive to light. Conjunctivae and EOM are normal. Right eye exhibits no discharge. Left eye exhibits no discharge. No scleral icterus.  Neck: Normal range of motion. Neck supple. No spinous process tenderness and no muscular tenderness present. No neck rigidity. Normal range of motion present. No thyromegaly present.  Cardiovascular: Normal rate, regular rhythm, normal heart sounds and intact distal pulses. Exam reveals no gallop and no friction rub.  No murmur heard. Pulmonary/Chest: Effort normal and breath sounds normal. No stridor. No respiratory distress. She has no wheezes. She has no rales.  Abdominal: Soft. Bowel sounds are normal. She exhibits no  distension. There is no tenderness. There is no rebound and no guarding.  Musculoskeletal: She exhibits no edema.  Lymphadenopathy:    She has no cervical adenopathy.  Neurological: She is alert. Coordination normal. GCS eye subscore is 4. GCS verbal subscore is 5. GCS motor subscore is 6.  CN 3-12 intact; normal sensation throughout; 5/5 strength in all 4 extremities; equal bilateral grip strength; no ataxia on finger to nose  Skin: Skin is warm and dry. No rash noted. She is not diaphoretic. No  pallor.  Psychiatric: She has a normal mood and affect.  Nursing note and vitals reviewed.    ED Treatments / Results  Labs (all labs ordered are listed, but only abnormal results are displayed) Labs Reviewed  BASIC METABOLIC PANEL - Abnormal; Notable for the following components:      Result Value   Sodium 130 (*)    Glucose, Bld 102 (*)    All other components within normal limits  CBC WITH DIFFERENTIAL/PLATELET - Abnormal; Notable for the following components:   RBC 5.37 (*)    Hemoglobin 15.2 (*)    HCT 46.1 (*)    All other components within normal limits    EKG None  Radiology Ct Head Wo Contrast  Result Date: 11/10/2018 CLINICAL DATA:  Pt reports headache x 2 weeks anteriorly across forehead to sides of face. States forehead and cheek bones are tender to touch. EXAM: CT HEAD WITHOUT CONTRAST TECHNIQUE: Contiguous axial images were obtained from the base of the skull through the vertex without intravenous contrast. COMPARISON:  None. FINDINGS: Brain: No evidence of acute infarction, hemorrhage, hydrocephalus, extra-axial collection or mass lesion/mass effect. Vascular: No hyperdense vessel or unexpected calcification. Skull: Normal. Negative for fracture or focal lesion. Sinuses/Orbits: No acute finding. Other: None. IMPRESSION: Normal exam. Electronically Signed   By: Nolon Nations M.D.   On: 11/10/2018 14:54    Procedures Procedures (including critical care  time)  Medications Ordered in ED Medications  sodium chloride 0.9 % bolus 500 mL (0 mLs Intravenous Stopped 11/10/18 1450)  metoCLOPramide (REGLAN) injection 10 mg (10 mg Intravenous Given 11/10/18 1335)  ketorolac (TORADOL) 30 MG/ML injection 15 mg (15 mg Intravenous Given 11/10/18 1335)     Initial Impression / Assessment and Plan / ED Course  I have reviewed the triage vital signs and the nursing notes.  Pertinent labs & imaging results that were available during my care of the patient were reviewed by me and considered in my medical decision making (see chart for details).     I suspect patient may be suffering from return of migraine headaches.  She is feeling much better after headache cocktail in the ED.  Low suspicion of any other emergent pathology including temporal arteritis, SAH, meningitis.  Labs are unremarkable.  CT head is normal.  Patient will be referred to PCP and neurology for further evaluation and treatment of headaches.  Return precautions discussed.  Patient understands and agrees with plan.  Patient vitals stable throughout ED course and discharged in satisfactory condition.  Patient also evaluated by my attending, Dr. Winfred Leeds, who got the patient's management and agrees with plan.  Final Clinical Impressions(s) / ED Diagnoses   Final diagnoses:  Bad headache    ED Discharge Orders    None       Frederica Kuster, Hershal Coria 11/10/18 2133    Orlie Dakin, MD 11/11/18 0700

## 2018-11-10 NOTE — ED Provider Notes (Addendum)
complains of frontal headache radiating to left temporal area onset 2 weeks ago she denies any visual changes denies fever.  She is been treated with prednisone and with amoxicillin, without relief.  She feels much improved since treatment here.  On exam patient is alert and in no distress HEENT exam no facial asymmetry eyes without subconjunctival erythema.  She is nontender over her scalp or forehead areas.  Neurologic L[place]o coma score 15 moves all extremities well cranial nerves II through XII grossly intact.   Orlie Dakin, MD 11/10/18 Pe Ell, MD 11/11/18 0700

## 2018-11-10 NOTE — ED Triage Notes (Signed)
Pt reports headache x 2 weeks anteriorly across forehead to sides of face.  States forehead and cheek bones are tender to touch. Was seen 2 wks ago with PCP at beginning of s/s and was prescribed prednisone taper, mucinex, and amoxicillin. HA 10/10 pain with pounding/bounding pressure.  Worse with getting up and moving around. Also feels heart beat is "pounding and racing and can feel heart beat in arms" since starting the prednisone. Denies N/V, fever, or other s/s. Denies Hx BP issues, does report Hx concussion after  Hitting top of head on closet door with similar feelings. PA at bedside to eval.

## 2018-11-12 DIAGNOSIS — G43901 Migraine, unspecified, not intractable, with status migrainosus: Secondary | ICD-10-CM | POA: Diagnosis not present

## 2018-11-15 DIAGNOSIS — E871 Hypo-osmolality and hyponatremia: Secondary | ICD-10-CM | POA: Diagnosis not present

## 2018-11-15 DIAGNOSIS — R51 Headache: Secondary | ICD-10-CM | POA: Diagnosis not present

## 2018-11-23 ENCOUNTER — Encounter: Payer: Self-pay | Admitting: Neurology

## 2018-11-23 ENCOUNTER — Ambulatory Visit (INDEPENDENT_AMBULATORY_CARE_PROVIDER_SITE_OTHER): Payer: Medicare Other | Admitting: Neurology

## 2018-11-23 VITALS — BP 186/96 | HR 89 | Ht 67.0 in | Wt 185.0 lb

## 2018-11-23 DIAGNOSIS — R51 Headache: Secondary | ICD-10-CM | POA: Diagnosis not present

## 2018-11-23 DIAGNOSIS — Z8669 Personal history of other diseases of the nervous system and sense organs: Secondary | ICD-10-CM

## 2018-11-23 DIAGNOSIS — R030 Elevated blood-pressure reading, without diagnosis of hypertension: Secondary | ICD-10-CM | POA: Diagnosis not present

## 2018-11-23 DIAGNOSIS — R519 Headache, unspecified: Secondary | ICD-10-CM

## 2018-11-23 NOTE — Patient Instructions (Addendum)
Please remember, common headache triggers are: sleep deprivation, dehydration, overheating, stress, hypoglycemia or skipping meals and blood sugar fluctuations, excessive pain medications or excessive alcohol use or caffeine withdrawal. Some people have food triggers such as aged cheese, orange juice or chocolate, especially dark chocolate, or MSG (monosodium glutamate). Try to avoid these headache triggers as much possible. It may be helpful to keep a headache diary to figure out what makes your headaches worse or brings them on and what alleviates them. Some people report headache onset after exercise but studies have shown that regular exercise may actually prevent headaches from coming. If you have exercise-induced headaches, please make sure that you drink plenty of fluid before and after exercising and that you do not over do it and do not overheat.  For headache evaluation, I would recommend evaluation with a brain MRI to rule out a structural cause.  I would also recommend a sleep study to rule out sleep apnea as a cause for BP elevation and recurrent headaches.  Please be cautious with the sumatriptan (Imitrex), as your blood pressure has been high and Imitrex can increase BP. Please monitor BP at home and talk to your primary care about BP management.   Please think about theses tests. If you like to proceed, I will order the MRI and sleep study.

## 2018-11-23 NOTE — Progress Notes (Signed)
Subjective:    Patient ID: Dawn Riggs is a 77 y.o. female.  HPI     Star Age, MD, PhD [facility] [address] [place], [place] [address]  I saw patient, Dawn Riggs, as a referral from the emergency room for headache. The patient is unaccompanied today. [name] is a 77 year old right-handed woman with an underlying medical history of breast cancer, thyroid cancer, osteopenia, hypothyroidism, skin cancer, obesity and status post multiple surgeries including back surgery, cholecystectomy, cataract removal, mastectomy b/l, skin cancer removal, thyroidectomy and tonsillectomy, who presented to the emergency room on 11/10/2018 with a two-week history of recurrent headaches. She does have a remote history of migrainous headaches. She had a head CT without contrast on 11/10/2018 and I reviewed the results: IMPRESSION: Normal exam. She was treated symptomatically with IV fluids, Reglan, Toradol, and improved. She was previously treated by PCP with antibiotic and prednisone without much in the way of relief. Her migraines improved in the past secondary to menopause.  She had a brain MRI with and without contrast on 09/06/2012 as well as MRA head without contrast on 09/06/2012 and I reviewed the results: IMPRESSION: 1.  Normal for age MRI appearance of the brain. 2.  MRA findings are below.   MRA HEAD WITHOUT CONTRAST: IMPRESSION: Vertebrobasilar and ICA siphon dolichoectasia, otherwise negative intracranial MRA. For her headaches she has been given Imitrex, she tried it twice and that seemed to help. She has also been given hydroxyzine. She has noticed blood pressure elevation recently and today it is elevated at 186/96. She reports no snoring. She has never had a sleep study. She is a nonsmoker and does not utilize alcohol or caffeine. She has a family history of congestive heart failure and stroke. She lives with her husband, she is  retired.  Her Epworth sleepiness score is 3 out of 24 today, fatigue score is 16 out of 63 today. She reports hormonally triggered migraines many years ago, but they improved and about a year ago she started having similar headaches to her migraines in the past, which are preceded by a brief visual aura, primarily blurry vision and precipitated by stress and sometimes by bending over to long. When she first started having a prolonged headache about a month ago she reports that she also had nasal drainage and congestion and was treated for a sinus infection and ear infection. She was told she had fluid behind the ears and was treated with amoxicillin, a steroid injection and then subsequently with a oral steroid Dosepak. She went back to urgent care and was recently given a prescription for hydroxyzine as she also reported increase in anxiety around these headaches and she was prescribed Imitrex at the time. She does not believe she has sleep apnea, her husband has OSA. She denies night to night nocturia or morning headaches and tries to keep a set sleep schedule, bedtime around 11 and rise time between 6 and 8 generally speaking. She had a tonsillectomy at age 30. She was found to have low sodium level when she was in the emergency room recently and upon recheck it was still low at her primary care physician's office.   Her Past Medical History Is Significant For: Past Medical History:  Diagnosis Date  . Breast cancer ([place])   . Cancer of skin of leg   . Cataract    bilateral- removed 02/2015  . Complication of anesthesia   . Fibroids   . Headache  hormonal headache - now resolved  . Hypothyroidism   . 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 Osteopenia   . PONV (postoperative nausea and vomiting)     Her Past Surgical History Is Significant For: Past Surgical History:  Procedure Laterality Date  . BACK SURGERY     L4-5  . BREAST SURGERY Left 1997  . CHOLECYSTECTOMY N/A  09/07/2013   Procedure: LAPAROSCOPIC CHOLECYSTECTOMY WITH INTRAOPERATIVE CHOLANGIOGRAM;  Surgeon: Merrie Roof, MD;  Location: [facility];  Service: General;  Laterality: N/A;  . EYE SURGERY     bilateral IOL, following cataracts removed   . LAMINECTOMY  [id number]  . LIPOMA EXCISION  08/2000   L posterior arm   . [facility]  . MASTECTOMY W/ SENTINEL NODE BIOPSY Right 12/12/2015  . SIMPLE MASTECTOMY WITH AXILLARY SENTINEL NODE BIOPSY Right 12/12/2015   Procedure: RIGHT MASTECTOMY WITH SENTINEL NODE MAPPING;  Surgeon: Autumn Messing III, MD;  Location: [facility];  Service: General;  Laterality: Right;  . SKIN CANCER EXCISION Right   . THYROIDECTOMY  [id number]   Dr Romelle Starcher, PA  . TONSILLECTOMY      Her Family History Is Significant For: Family History  Problem Relation Age of Onset  . Hypertension Mother   . Stroke Mother   . Early death Father   . Cancer Brother        lung  . Heart disease Brother   . Cancer Maternal Grandfather        non hodgkins  . Cancer Paternal Grandfather        skin  . Cancer Brother 66       leukemia    Her Social History Is Significant For: Social History   Socioeconomic History  . Marital status: Married    Spouse name: Not on file  . Number of children: Not on file  . Years of education: Not on file  . Highest education level: Not on file  Occupational History  . Not on file  Social Needs  . Financial resource strain: Not on file  . Food insecurity:    Worry: Not on file    Inability: Not on file  . Transportation needs:    Medical: Not on file    Non-medical: Not on file  Tobacco Use  . Smoking status: Never Smoker  . Smokeless tobacco: Never Used  Substance and Sexual Activity  . Alcohol use: No  . Drug use: No  . Sexual activity: Never    Birth control/protection: Post-menopausal  Lifestyle  . Physical activity:    Days per week: Not on file    Minutes per session: Not on file  . Stress: Not  on file  Relationships  . Social connections:    Talks on phone: Not on file    Gets together: Not on file    Attends religious service: Not on file    Active member of club or organization: Not on file    Attends meetings of clubs or organizations: Not on file    Relationship status: Not on file  Other Topics Concern  . Not on file  Social History Narrative  . Not on file    Her Allergies Are:  No Known Allergies:   Her Current Medications Are:  Outpatient Encounter Medications as of 11/23/2018  Medication Sig  . butalbital-acetaminophen-caffeine (FIORICET, ESGIC) 50-325-40 MG tablet Take 1 tablet by mouth every 6 (six) hours as needed  for headache.  . cholecalciferol (VITAMIN D) 400 UNITS TABS tablet Take 2,000 Units by mouth daily.  . hydrOXYzine (ATARAX/VISTARIL) 25 MG tablet Take 25 mg by mouth 3 (three) times daily.  Marland Kitchen ipratropium (ATROVENT) 0.06 % nasal spray   . levothyroxine (UNITHROID) 112 MCG tablet Take 112 mcg by mouth daily.  . Multiple Vitamins-Minerals (CENTRUM SILVER ADULT 50+) TABS Take 1 tablet by mouth daily.   . SUMAtriptan (IMITREX) 100 MG tablet Take 100 mg by mouth every 2 (two) hours as needed for migraine. May repeat in 2 hours if headache persists or recurs.  . [DISCONTINUED] acetaminophen (TYLENOL) 500 MG tablet Take 1,000 mg by mouth every 6 (six) hours as needed.  . [DISCONTINUED] ibuprofen (ADVIL,MOTRIN) 200 MG tablet Take 200 mg by mouth every 6 (six) hours as needed.   No facility-administered encounter medications on file as of 11/23/2018.   :   Review of Systems:  Out of a complete 14 point review of systems, all are reviewed and negative with the exception of these symptoms as listed below:  Review of Systems  Neurological:       Pt presents today to discuss a headache that has lasted 4 weeks. Pt has tried imitrex which has helped. Pt was also given hydroxyzine, prednisone, abx, fioricet, and nasal sprays to help her headache. Pt has never had  a sleep study and does not endorse snoring.  Epworth Sleepiness Scale 0= would never doze 1= slight chance of dozing 2= moderate chance of dozing 3= high chance of dozing  Sitting and reading: 1 Watching TV: 1 Sitting inactive in a public place (ex. Theater or meeting): 0 As a passenger in a car for an hour without a break: 0 Lying down to rest in the afternoon: 1 Sitting and talking to someone: 0 Sitting quietly after lunch (no alcohol): 0 In a car, while stopped in traffic: 0 Total: 3     Objective:  Neurological Exam  Physical Exam Physical Examination:   Vitals:   11/23/18 1008  BP: (!) 186/96  Pulse: 89   General Examination: The patient is a very pleasant 77 y.o. female in no acute distress. She appears well-developed and well-nourished and well groomed.   HEENT: Normocephalic, atraumatic, pupils are equal, round and reactive to light and accommodation. Extraocular tracking is good without limitation to gaze excursion or nystagmus noted. Normal smooth pursuit is noted. Hearing is grossly intact. Face is symmetric with normal facial animation and normal facial sensation. Speech is clear with no dysarthria noted. There is no hypophonia. There is no lip, neck/head, jaw or voice tremor. Neck is supple with full range of passive and active motion. There are no carotid bruits on auscultation. Oropharynx exam reveals: mild mouth dryness, adequate dental hygiene and mild airway crowding, due to smaller airway entry, Mallampati is class I, neck circumference is 15 inches, tonsils absent. Tongue protrudes centrally and palate elevates symmetrically.  Chest: Clear to auscultation without wheezing, rhonchi or crackles noted.  Heart: S1+S2+0, regular and normal without murmurs, rubs or gallops noted.   Abdomen: Soft, non-tender and non-distended with normal bowel sounds appreciated on auscultation.  Extremities: There is trace pitting edema around the ankles bilaterally, left more  than right.  Skin: Warm and dry without trophic changes noted.  Musculoskeletal: exam reveals no obvious joint deformities, tenderness or joint swelling or erythema.   Neurologically:  Mental status: The patient is awake, alert and oriented in all 4 spheres. Her immediate and remote memory, attention, language  skills and fund of knowledge are appropriate. There is no evidence of aphasia, agnosia, apraxia or anomia. Speech is clear with normal prosody and enunciation. Thought process is linear. Mood is normal and affect is normal.  Cranial nerves II - XII are as described above under HEENT exam. In addition: shoulder shrug is normal with equal shoulder height noted. Motor exam: Normal bulk, strength and tone is noted. There is no drift, tremor or rebound. Romberg is negative. Reflexes are 2+ throughout. Fine motor skills and coordination: intact with normal finger taps, normal hand movements, normal rapid alternating patting, normal foot taps and normal foot agility.  Cerebellar testing: No dysmetria or intention tremor on finger to nose testing. Heel to shin is unremarkable bilaterally. There is no truncal or gait ataxia.  Sensory exam: intact to light touch in the upper and lower extremities.  Gait, station and balance: She stands easily. No veering to one side is noted. No leaning to one side is noted. Posture is age-appropriate and stance is narrow based. Gait shows normal stride length and normal pace. No problems turning are noted. Tandem walk is unremarkable.  Assessment and Plan:    In summary, ZULAY CORRIE is a very pleasant 77 y.o.-year old female with an underlying medical history of breast cancer, thyroid cancer, osteopenia, hypothyroidism, skin cancer, obesity and status post multiple surgeries including back surgery, cholecystectomy, cataract removal, mastectomy b/l, skin cancer removal, thyroidectomy and tonsillectomy, whopresents as a referral from the emergency room for  evaluation of her recurrent headaches. She has a prior history of migrainous headaches years ago and recently presented about a month ago with recurrent headaches. I would like to proceed with evaluation for an underlying cause of her headache recurrence, some of her headaches may be migrainous in nature, also attention headache component may be possible, she does endorse recent increase in anxiety. Given the recent occurrence of headaches and her history of breast cancer would like to proceed with a brain MRI with and without contrast. She does not wish to proceed with a brain scan at this time. Given her elevated blood pressure values and also waking up with a headache at times and would like to rule out sleep apnea and would like to proceed with a sleep study. She would like to hold off at this time. She was given a short prescription for Imitrex for as needed use. She is cautioned about the use of Imitrex as it can increase blood pressure and her blood pressure is already elevated and has been trending higher. She is encouraged to talk to her primary care physician about anxiety and stress management as well as the pressure management. She is encouraged to think about diagnostic testing in the form of brain MRI with and without contrast and sleep study. She is encouraged to call our office when she is ready to proceed. For now, we mutually agreed for an as needed follow-up and she will follow-up with her PCP for now. I advised the patient about common headache triggers: sleep deprivation, dehydration, overheating, stress, hypoglycemia or skipping meals and blood sugar fluctuations, excessive pain medications or excessive alcohol use or caffeine withdrawal. Some people have food triggers such as aged cheese, orange juice or chocolate, especially dark chocolate, or MSG (monosodium glutamate). She is to try to avoid these headache triggers as much possible. It may be helpful to keep a headache diary to figure out  what makes Her headaches worse or brings them on and what alleviates them. Some  people report headache onset after exercise but studies have shown that regular exercise may actually prevent headaches from coming. If She has exercise-induced headaches, She is advised to drink plenty of fluid before and after exercising and that to not overdo it and to not overheat. I answered all her questions today and the patient was in agreement.    Star Age, MD, PhD

## 2018-11-28 DIAGNOSIS — F418 Other specified anxiety disorders: Secondary | ICD-10-CM | POA: Diagnosis not present

## 2018-11-28 DIAGNOSIS — R51 Headache: Secondary | ICD-10-CM | POA: Diagnosis not present

## 2018-12-03 DIAGNOSIS — R51 Headache: Secondary | ICD-10-CM | POA: Diagnosis not present

## 2018-12-03 DIAGNOSIS — R110 Nausea: Secondary | ICD-10-CM | POA: Diagnosis not present

## 2018-12-06 ENCOUNTER — Telehealth: Payer: Self-pay | Admitting: Neurology

## 2018-12-06 DIAGNOSIS — G4489 Other headache syndrome: Secondary | ICD-10-CM

## 2018-12-06 DIAGNOSIS — Z853 Personal history of malignant neoplasm of breast: Secondary | ICD-10-CM

## 2018-12-06 DIAGNOSIS — R51 Headache: Principal | ICD-10-CM

## 2018-12-06 DIAGNOSIS — Z8669 Personal history of other diseases of the nervous system and sense organs: Secondary | ICD-10-CM

## 2018-12-06 DIAGNOSIS — R519 Headache, unspecified: Secondary | ICD-10-CM

## 2018-12-06 NOTE — Telephone Encounter (Signed)
Pt called to schedule MRI- currently no order placed.

## 2018-12-06 NOTE — Telephone Encounter (Signed)
I called pt and advised her that the MRI brain has been ordered and she should get a call either from [place] or [facility] to schedule this MRI. Pt verbalized understanding.

## 2018-12-06 NOTE — Telephone Encounter (Signed)
Medicare/BCBS supp no auth order sent to GI . I spoke to the patient and she is aware I gave her GI phone number of [phone] and informed her if she has not heard in the next 2-3 business days to give them a call.Dawn Riggs

## 2018-12-06 NOTE — Telephone Encounter (Signed)
I have placed an order for brain MRI w/wo contrast.

## 2018-12-08 ENCOUNTER — Ambulatory Visit
Admission: RE | Admit: 2018-12-08 | Discharge: 2018-12-08 | Disposition: A | Discharge status: Home / Self Care | Payer: Medicare Other | Source: Ambulatory Visit | Attending: Neurology | Admitting: Neurology

## 2018-12-08 DIAGNOSIS — Z853 Personal history of malignant neoplasm of breast: Secondary | ICD-10-CM

## 2018-12-08 DIAGNOSIS — R51 Headache: Secondary | ICD-10-CM | POA: Diagnosis not present

## 2018-12-08 DIAGNOSIS — G4489 Other headache syndrome: Secondary | ICD-10-CM

## 2018-12-08 DIAGNOSIS — Z8669 Personal history of other diseases of the nervous system and sense organs: Secondary | ICD-10-CM

## 2018-12-08 DIAGNOSIS — R519 Headache, unspecified: Secondary | ICD-10-CM

## 2018-12-08 MED ORDER — GADOBENATE DIMEGLUMINE 529 MG/ML IV SOLN
16.0000 mL | Freq: Once | INTRAVENOUS | Status: AC | PRN
  Administered 2018-12-08: 16 mL via INTRAVENOUS

## 2018-12-12 ENCOUNTER — Telehealth: Payer: Self-pay | Admitting: Neurology

## 2018-12-12 ENCOUNTER — Telehealth: Payer: Self-pay

## 2018-12-12 NOTE — Telephone Encounter (Signed)
-----   Message from Star Age, MD sent at 12/12/2018 10:53 AM EST ----- Please call pt: brain MRI w/wo contrast shows normal for age findings, which is reassuring.  I had recommended sleep study to r/o OSA. She is encouraged to think about it. If she is agreeable, we can order.  I recommend FU with PCP for BP management.  I will see her back prn.  sa

## 2018-12-12 NOTE — Telephone Encounter (Signed)
See other telephone note from today 

## 2018-12-12 NOTE — Telephone Encounter (Signed)
Pt is asking for a call once MRI results are available °

## 2018-12-12 NOTE — Progress Notes (Signed)
Please call pt: brain MRI w/wo contrast shows normal for age findings, which is reassuring.  I had recommended sleep study to r/o OSA. She is encouraged to think about it. If she is agreeable, we can order.  I recommend FU with PCP for BP management.  I will see her back prn.  sa

## 2018-12-12 NOTE — Telephone Encounter (Signed)
I called pt and discussed her MRI results with her. Pt will follow up with PCP regarding BP management. Pt declined a sleep study at this time. Pt verbalized understanding of results. Pt had no questions at this time but was encouraged to call back if questions arise.

## 2019-03-10 DIAGNOSIS — E559 Vitamin D deficiency, unspecified: Secondary | ICD-10-CM | POA: Diagnosis not present

## 2019-03-10 DIAGNOSIS — Z131 Encounter for screening for diabetes mellitus: Secondary | ICD-10-CM | POA: Diagnosis not present

## 2019-03-10 DIAGNOSIS — Z86010 Personal history of colonic polyps: Secondary | ICD-10-CM | POA: Diagnosis not present

## 2019-03-10 DIAGNOSIS — E039 Hypothyroidism, unspecified: Secondary | ICD-10-CM | POA: Diagnosis not present

## 2019-03-10 DIAGNOSIS — Z853 Personal history of malignant neoplasm of breast: Secondary | ICD-10-CM | POA: Diagnosis not present

## 2019-03-10 DIAGNOSIS — M8580 Other specified disorders of bone density and structure, unspecified site: Secondary | ICD-10-CM | POA: Diagnosis not present

## 2019-03-10 DIAGNOSIS — G43901 Migraine, unspecified, not intractable, with status migrainosus: Secondary | ICD-10-CM | POA: Diagnosis not present

## 2019-03-10 DIAGNOSIS — Z13220 Encounter for screening for lipoid disorders: Secondary | ICD-10-CM | POA: Diagnosis not present

## 2019-03-10 DIAGNOSIS — Z85850 Personal history of malignant neoplasm of thyroid: Secondary | ICD-10-CM | POA: Diagnosis not present

## 2019-09-15 DIAGNOSIS — Z136 Encounter for screening for cardiovascular disorders: Secondary | ICD-10-CM | POA: Diagnosis not present

## 2019-09-15 DIAGNOSIS — G43901 Migraine, unspecified, not intractable, with status migrainosus: Secondary | ICD-10-CM | POA: Diagnosis not present

## 2019-09-15 DIAGNOSIS — Z853 Personal history of malignant neoplasm of breast: Secondary | ICD-10-CM | POA: Diagnosis not present

## 2019-09-15 DIAGNOSIS — M8580 Other specified disorders of bone density and structure, unspecified site: Secondary | ICD-10-CM | POA: Diagnosis not present

## 2019-09-15 DIAGNOSIS — E559 Vitamin D deficiency, unspecified: Secondary | ICD-10-CM | POA: Diagnosis not present

## 2019-09-15 DIAGNOSIS — M25561 Pain in right knee: Secondary | ICD-10-CM | POA: Diagnosis not present

## 2019-09-15 DIAGNOSIS — E871 Hypo-osmolality and hyponatremia: Secondary | ICD-10-CM | POA: Diagnosis not present

## 2019-09-15 DIAGNOSIS — Z85850 Personal history of malignant neoplasm of thyroid: Secondary | ICD-10-CM | POA: Diagnosis not present

## 2019-09-15 DIAGNOSIS — E039 Hypothyroidism, unspecified: Secondary | ICD-10-CM | POA: Diagnosis not present

## 2019-09-15 DIAGNOSIS — R51 Headache: Secondary | ICD-10-CM | POA: Diagnosis not present

## 2019-09-15 DIAGNOSIS — C50911 Malignant neoplasm of unspecified site of right female breast: Secondary | ICD-10-CM | POA: Diagnosis not present

## 2019-09-15 DIAGNOSIS — Z131 Encounter for screening for diabetes mellitus: Secondary | ICD-10-CM | POA: Diagnosis not present

## 2019-09-19 DIAGNOSIS — M8580 Other specified disorders of bone density and structure, unspecified site: Secondary | ICD-10-CM | POA: Diagnosis not present

## 2019-09-19 DIAGNOSIS — Z8669 Personal history of other diseases of the nervous system and sense organs: Secondary | ICD-10-CM | POA: Diagnosis not present

## 2019-09-19 DIAGNOSIS — E039 Hypothyroidism, unspecified: Secondary | ICD-10-CM | POA: Diagnosis not present

## 2019-10-03 DIAGNOSIS — Z23 Encounter for immunization: Secondary | ICD-10-CM | POA: Diagnosis not present

## 2019-12-05 DIAGNOSIS — K635 Polyp of colon: Secondary | ICD-10-CM | POA: Diagnosis not present

## 2019-12-05 DIAGNOSIS — Z86010 Personal history of colonic polyps: Secondary | ICD-10-CM | POA: Diagnosis not present

## 2019-12-05 DIAGNOSIS — Z1211 Encounter for screening for malignant neoplasm of colon: Secondary | ICD-10-CM | POA: Diagnosis not present

## 2019-12-05 DIAGNOSIS — D175 Benign lipomatous neoplasm of intra-abdominal organs: Secondary | ICD-10-CM | POA: Diagnosis not present

## 2020-01-30 DIAGNOSIS — M79673 Pain in unspecified foot: Secondary | ICD-10-CM | POA: Diagnosis not present

## 2020-01-30 DIAGNOSIS — M778 Other enthesopathies, not elsewhere classified: Secondary | ICD-10-CM | POA: Diagnosis not present

## 2020-02-20 DIAGNOSIS — M25522 Pain in left elbow: Secondary | ICD-10-CM | POA: Diagnosis not present

## 2020-02-22 DIAGNOSIS — M25522 Pain in left elbow: Secondary | ICD-10-CM | POA: Diagnosis not present

## 2020-02-26 DIAGNOSIS — M25522 Pain in left elbow: Secondary | ICD-10-CM | POA: Diagnosis not present

## 2020-02-28 DIAGNOSIS — M25522 Pain in left elbow: Secondary | ICD-10-CM | POA: Diagnosis not present

## 2020-03-04 DIAGNOSIS — M25522 Pain in left elbow: Secondary | ICD-10-CM | POA: Diagnosis not present

## 2020-03-06 DIAGNOSIS — M25522 Pain in left elbow: Secondary | ICD-10-CM | POA: Diagnosis not present

## 2020-03-06 IMAGING — CT CT HEAD W/O CM
4 series · 17 of 47 positions shown, 19 images · non-contrast
Comparison: None.

CLINICAL DATA: Pt reports headache x 2 weeks anteriorly across
forehead to sides of face. States forehead and cheek bones are
tender to touch.

EXAM:
CT HEAD WITHOUT CONTRAST
TECHNIQUE: Contiguous axial images were obtained from the base of the skull
through the vertex without intravenous contrast.

[Series 3: head without · axial · non-contrast · 0.44mm/px · z∈[-100,+20]mm · 7 of 34 slices shown, 9 images]
[im 5/34  brain]
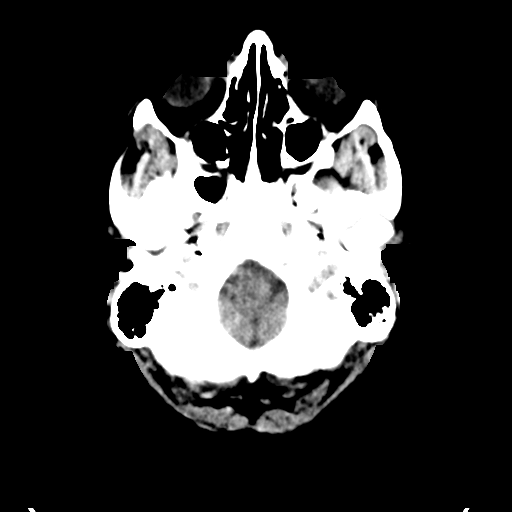
[im 5/34  bone]
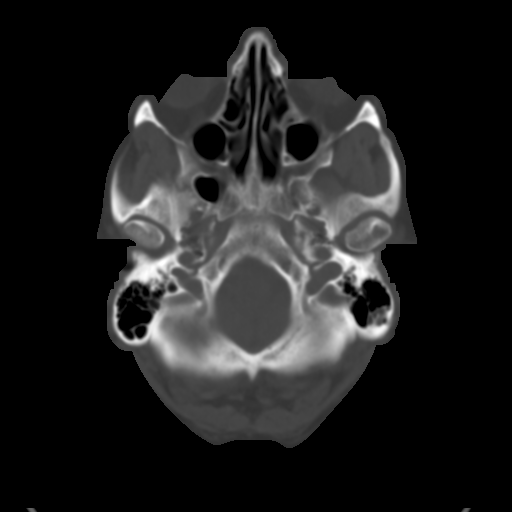
[im 9/34  brain]
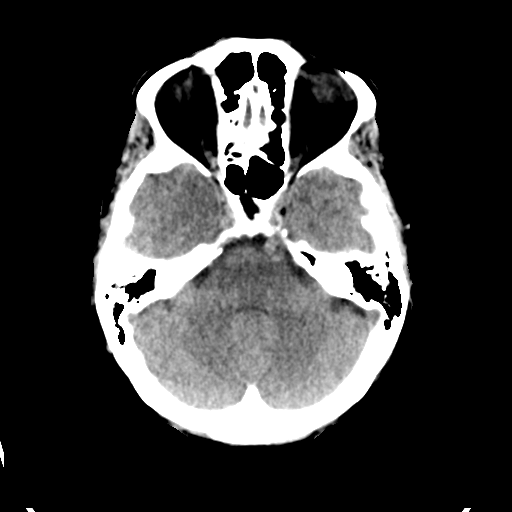
[im 13/34  brain]
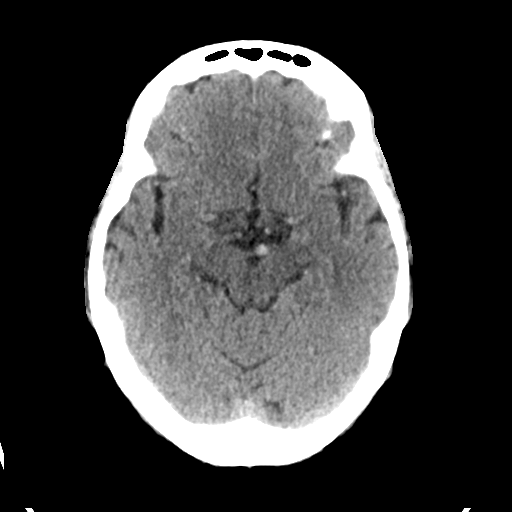
[im 17/34  brain]
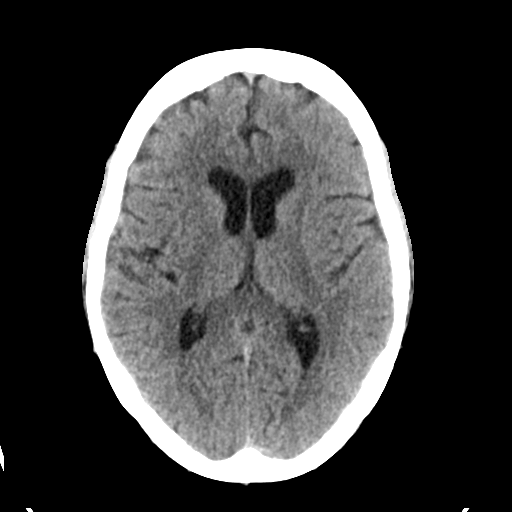
[im 21/34  brain]
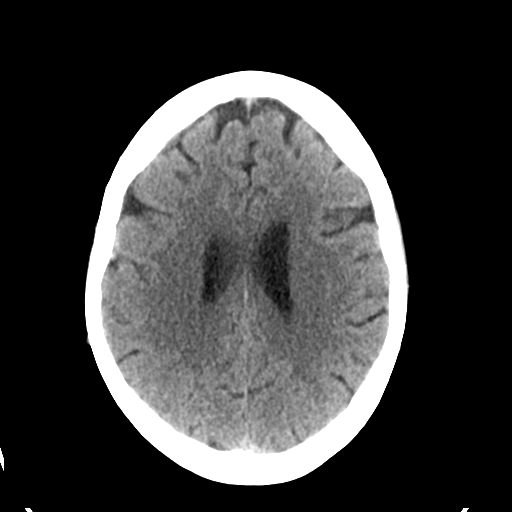
[im 21/34  bone]
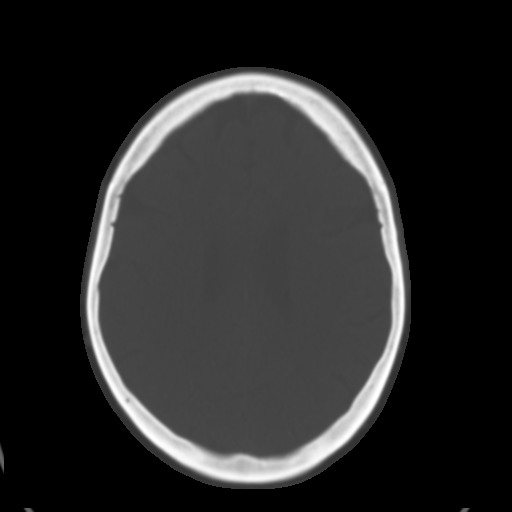
[im 25/34  brain]
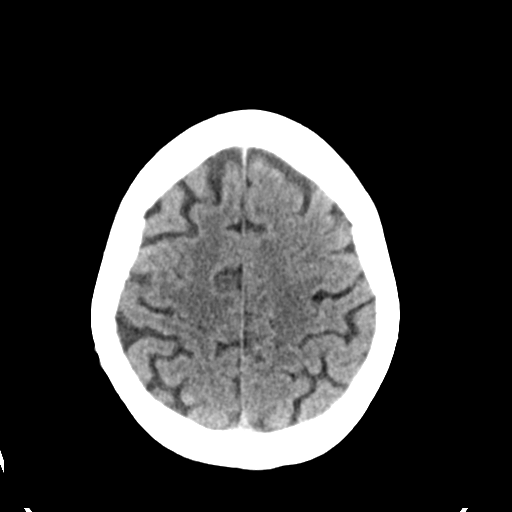
[im 29/34  brain]
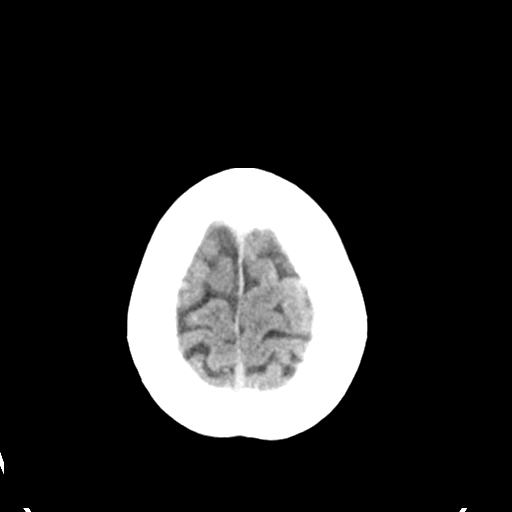

[Series 4: head bone · axial · 0.44mm/px · z∈[-104,-46]mm · 4 of 85 slices shown]
[im 9/85  bone]
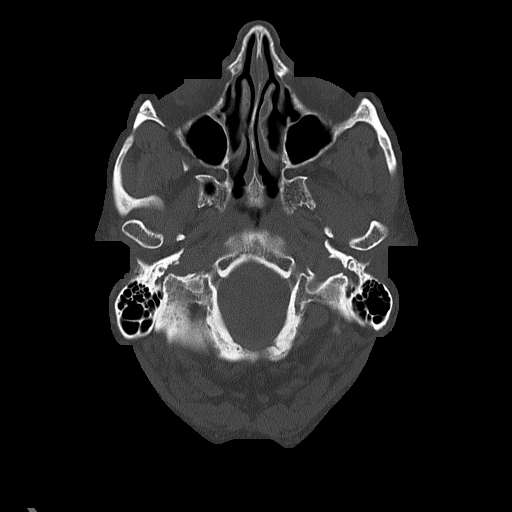
[im 17/85  bone]
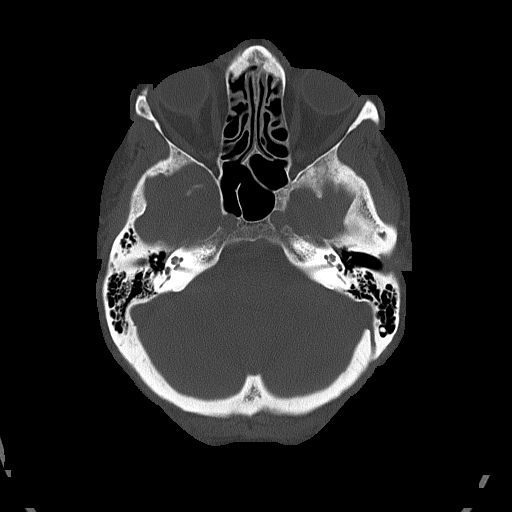
[im 26/85  bone]
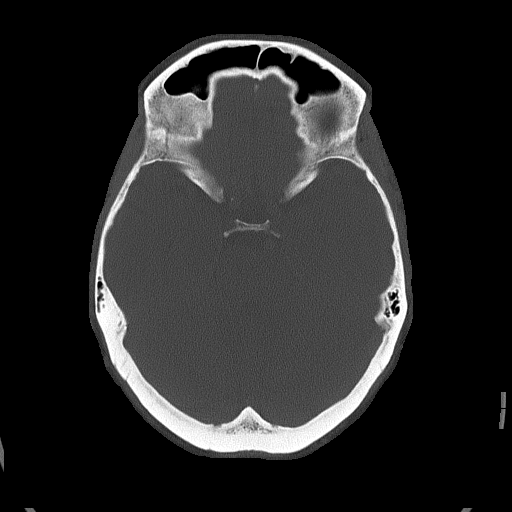
[im 38/85  bone]
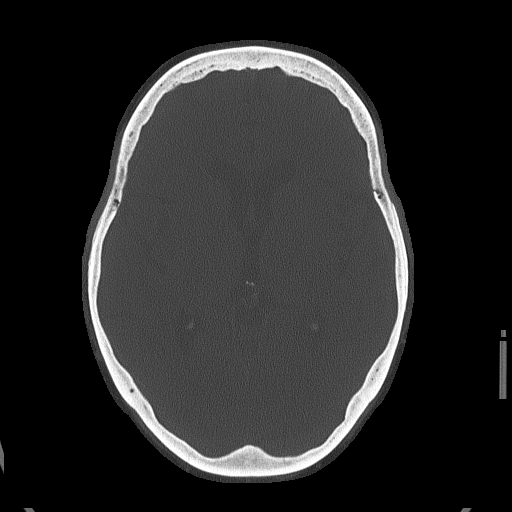

[Series 5: head without cor · coronal · non-contrast · 0.33mm/px · 3 of 70 slices shown]
[im 24/70  brain]
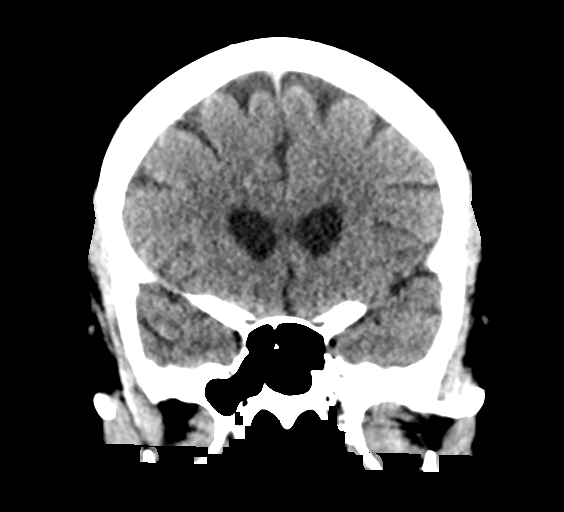
[im 31/70  brain]
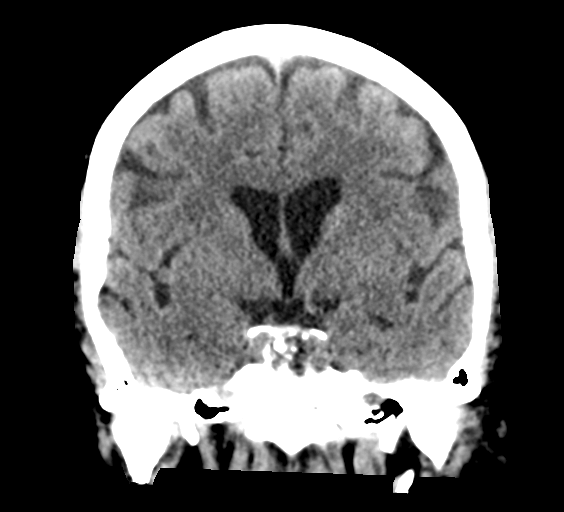
[im 39/70  brain]
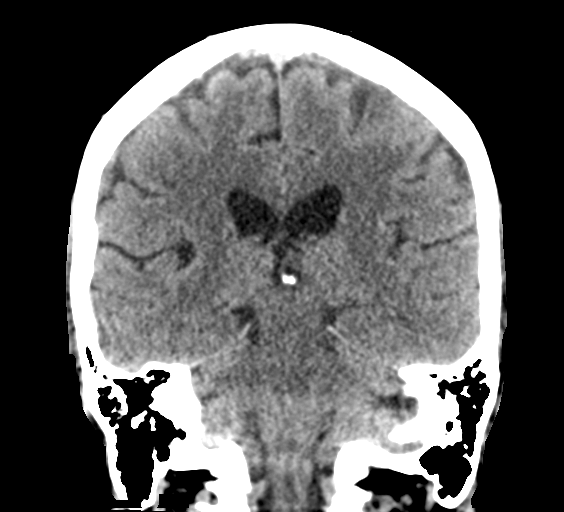

[Series 6: head without sag · sagittal · non-contrast · 0.33mm/px · 3 of 62 slices shown]
[im 21/62  brain]
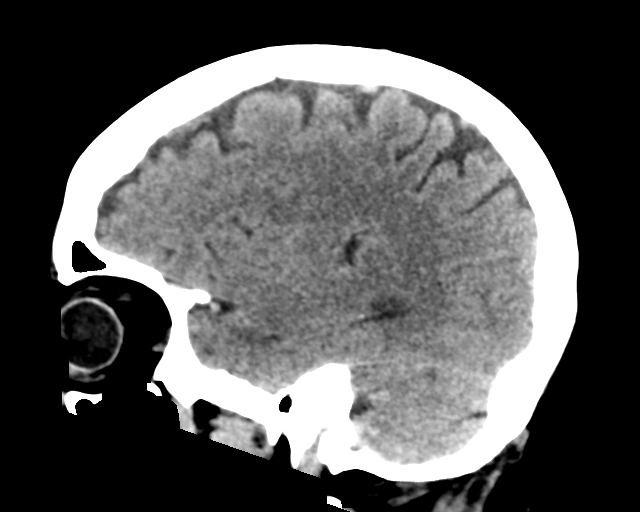
[im 31/62  brain]
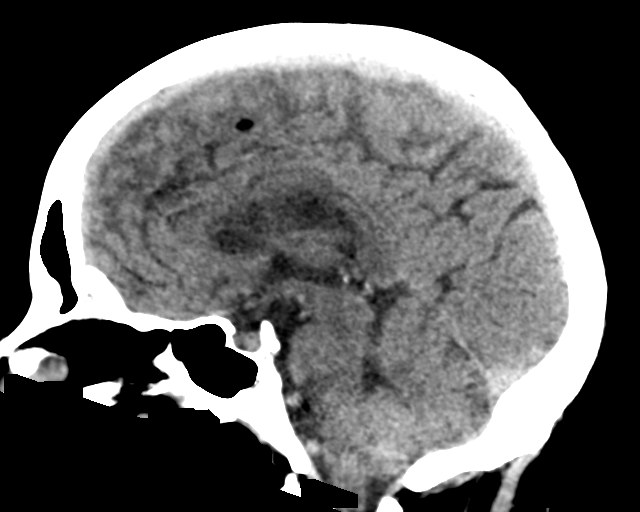
[im 41/62  brain]
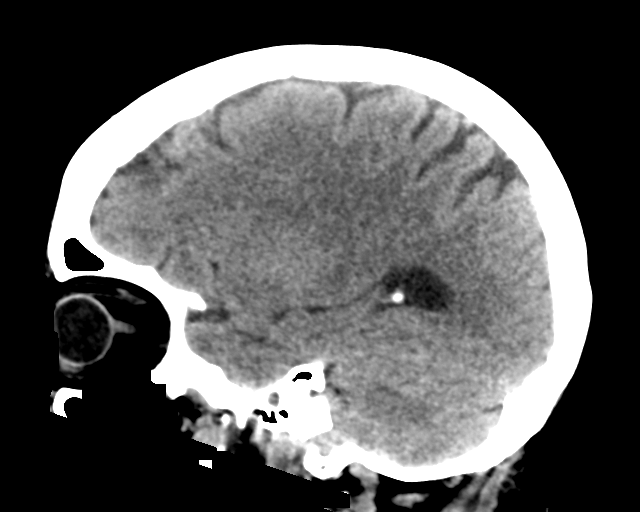

[17 of 47 positions shown; findings below may reference images not displayed]

FINDINGS: Brain: No evidence of acute infarction, hemorrhage, hydrocephalus,
extra-axial collection or mass lesion/mass effect.

Vascular: No hyperdense vessel or unexpected calcification.

Skull: Normal. Negative for fracture or focal lesion.

Sinuses/Orbits: No acute finding.

Other: None.
IMPRESSION: Normal exam.

## 2020-03-07 DIAGNOSIS — M899 Disorder of bone, unspecified: Secondary | ICD-10-CM | POA: Diagnosis not present

## 2020-03-07 DIAGNOSIS — Z6830 Body mass index (BMI) 30.0-30.9, adult: Secondary | ICD-10-CM | POA: Diagnosis not present

## 2020-03-07 DIAGNOSIS — L900 Lichen sclerosus et atrophicus: Secondary | ICD-10-CM | POA: Diagnosis not present

## 2020-03-07 DIAGNOSIS — M8580 Other specified disorders of bone density and structure, unspecified site: Secondary | ICD-10-CM | POA: Diagnosis not present

## 2020-03-07 DIAGNOSIS — Z01419 Encounter for gynecological examination (general) (routine) without abnormal findings: Secondary | ICD-10-CM | POA: Diagnosis not present

## 2020-03-07 DIAGNOSIS — Z1211 Encounter for screening for malignant neoplasm of colon: Secondary | ICD-10-CM | POA: Diagnosis not present

## 2020-03-07 DIAGNOSIS — C50911 Malignant neoplasm of unspecified site of right female breast: Secondary | ICD-10-CM | POA: Diagnosis not present

## 2020-03-12 DIAGNOSIS — M25522 Pain in left elbow: Secondary | ICD-10-CM | POA: Diagnosis not present

## 2020-03-14 DIAGNOSIS — M25522 Pain in left elbow: Secondary | ICD-10-CM | POA: Diagnosis not present

## 2020-03-21 DIAGNOSIS — M25522 Pain in left elbow: Secondary | ICD-10-CM | POA: Diagnosis not present

## 2020-06-06 DIAGNOSIS — H8303 Labyrinthitis, bilateral: Secondary | ICD-10-CM | POA: Diagnosis not present

## 2020-07-30 DIAGNOSIS — M79671 Pain in right foot: Secondary | ICD-10-CM | POA: Diagnosis not present

## 2020-07-30 DIAGNOSIS — I10 Essential (primary) hypertension: Secondary | ICD-10-CM | POA: Diagnosis not present

## 2020-07-30 DIAGNOSIS — M4800 Spinal stenosis, site unspecified: Secondary | ICD-10-CM | POA: Diagnosis not present

## 2020-07-30 DIAGNOSIS — G43009 Migraine without aura, not intractable, without status migrainosus: Secondary | ICD-10-CM | POA: Diagnosis not present

## 2020-08-01 DIAGNOSIS — M545 Low back pain: Secondary | ICD-10-CM | POA: Diagnosis not present

## 2020-08-06 DIAGNOSIS — M545 Low back pain: Secondary | ICD-10-CM | POA: Diagnosis not present

## 2020-08-08 DIAGNOSIS — M545 Low back pain: Secondary | ICD-10-CM | POA: Diagnosis not present

## 2020-08-16 ENCOUNTER — Other Ambulatory Visit: Payer: Self-pay | Admitting: Family Medicine

## 2020-08-16 DIAGNOSIS — M4800 Spinal stenosis, site unspecified: Secondary | ICD-10-CM

## 2020-08-16 DIAGNOSIS — M545 Low back pain: Secondary | ICD-10-CM | POA: Diagnosis not present

## 2020-08-20 DIAGNOSIS — M545 Low back pain: Secondary | ICD-10-CM | POA: Diagnosis not present

## 2020-08-22 DIAGNOSIS — M545 Low back pain: Secondary | ICD-10-CM | POA: Diagnosis not present

## 2020-08-27 DIAGNOSIS — M545 Low back pain: Secondary | ICD-10-CM | POA: Diagnosis not present

## 2020-08-29 DIAGNOSIS — M545 Low back pain: Secondary | ICD-10-CM | POA: Diagnosis not present

## 2020-09-06 ENCOUNTER — Other Ambulatory Visit: Payer: Self-pay

## 2020-09-06 ENCOUNTER — Ambulatory Visit
Admission: RE | Admit: 2020-09-06 | Discharge: 2020-09-06 | Disposition: A | Discharge status: Home / Self Care | Payer: Medicare Other | Source: Ambulatory Visit | Attending: Family Medicine | Admitting: Family Medicine

## 2020-09-06 DIAGNOSIS — M5126 Other intervertebral disc displacement, lumbar region: Secondary | ICD-10-CM | POA: Diagnosis not present

## 2020-09-06 DIAGNOSIS — M47816 Spondylosis without myelopathy or radiculopathy, lumbar region: Secondary | ICD-10-CM | POA: Diagnosis not present

## 2020-09-06 DIAGNOSIS — M4800 Spinal stenosis, site unspecified: Secondary | ICD-10-CM

## 2020-09-06 DIAGNOSIS — M5127 Other intervertebral disc displacement, lumbosacral region: Secondary | ICD-10-CM | POA: Diagnosis not present

## 2020-09-06 DIAGNOSIS — M48061 Spinal stenosis, lumbar region without neurogenic claudication: Secondary | ICD-10-CM | POA: Diagnosis not present

## 2020-09-06 DIAGNOSIS — Z23 Encounter for immunization: Secondary | ICD-10-CM | POA: Diagnosis not present

## 2020-09-19 DIAGNOSIS — M5126 Other intervertebral disc displacement, lumbar region: Secondary | ICD-10-CM | POA: Diagnosis not present

## 2020-10-01 DIAGNOSIS — Z8669 Personal history of other diseases of the nervous system and sense organs: Secondary | ICD-10-CM | POA: Diagnosis not present

## 2020-10-01 DIAGNOSIS — M8580 Other specified disorders of bone density and structure, unspecified site: Secondary | ICD-10-CM | POA: Diagnosis not present

## 2020-10-01 DIAGNOSIS — E039 Hypothyroidism, unspecified: Secondary | ICD-10-CM | POA: Diagnosis not present

## 2020-10-01 DIAGNOSIS — E559 Vitamin D deficiency, unspecified: Secondary | ICD-10-CM | POA: Diagnosis not present

## 2020-10-02 DIAGNOSIS — Z6830 Body mass index (BMI) 30.0-30.9, adult: Secondary | ICD-10-CM | POA: Diagnosis not present

## 2020-10-02 DIAGNOSIS — M25551 Pain in right hip: Secondary | ICD-10-CM | POA: Diagnosis not present

## 2020-10-02 DIAGNOSIS — M5136 Other intervertebral disc degeneration, lumbar region: Secondary | ICD-10-CM | POA: Diagnosis not present

## 2020-10-02 DIAGNOSIS — M5134 Other intervertebral disc degeneration, thoracic region: Secondary | ICD-10-CM | POA: Diagnosis not present

## 2020-10-02 DIAGNOSIS — R030 Elevated blood-pressure reading, without diagnosis of hypertension: Secondary | ICD-10-CM | POA: Diagnosis not present

## 2020-10-08 DIAGNOSIS — E039 Hypothyroidism, unspecified: Secondary | ICD-10-CM | POA: Diagnosis not present

## 2020-10-08 DIAGNOSIS — M8580 Other specified disorders of bone density and structure, unspecified site: Secondary | ICD-10-CM | POA: Diagnosis not present

## 2020-10-08 DIAGNOSIS — Z8669 Personal history of other diseases of the nervous system and sense organs: Secondary | ICD-10-CM | POA: Diagnosis not present

## 2020-10-08 DIAGNOSIS — M5124 Other intervertebral disc displacement, thoracic region: Secondary | ICD-10-CM | POA: Diagnosis not present

## 2020-10-30 DIAGNOSIS — M5134 Other intervertebral disc degeneration, thoracic region: Secondary | ICD-10-CM | POA: Diagnosis not present

## 2020-10-30 DIAGNOSIS — M5136 Other intervertebral disc degeneration, lumbar region: Secondary | ICD-10-CM | POA: Diagnosis not present

## 2020-10-30 DIAGNOSIS — M25551 Pain in right hip: Secondary | ICD-10-CM | POA: Diagnosis not present

## 2020-11-05 DIAGNOSIS — J0140 Acute pansinusitis, unspecified: Secondary | ICD-10-CM | POA: Diagnosis not present

## 2020-11-05 DIAGNOSIS — M26609 Unspecified temporomandibular joint disorder, unspecified side: Secondary | ICD-10-CM | POA: Diagnosis not present

## 2020-11-10 DIAGNOSIS — Z23 Encounter for immunization: Secondary | ICD-10-CM | POA: Diagnosis not present

## 2020-12-17 DIAGNOSIS — H9201 Otalgia, right ear: Secondary | ICD-10-CM | POA: Diagnosis not present

## 2020-12-17 DIAGNOSIS — J22 Unspecified acute lower respiratory infection: Secondary | ICD-10-CM | POA: Diagnosis not present

## 2020-12-17 DIAGNOSIS — U071 COVID-19: Secondary | ICD-10-CM | POA: Diagnosis not present

## 2021-02-04 DIAGNOSIS — M5134 Other intervertebral disc degeneration, thoracic region: Secondary | ICD-10-CM | POA: Diagnosis not present

## 2021-02-04 DIAGNOSIS — M5136 Other intervertebral disc degeneration, lumbar region: Secondary | ICD-10-CM | POA: Diagnosis not present

## 2021-02-04 DIAGNOSIS — M25551 Pain in right hip: Secondary | ICD-10-CM | POA: Diagnosis not present

## 2021-03-05 DIAGNOSIS — H26491 Other secondary cataract, right eye: Secondary | ICD-10-CM | POA: Diagnosis not present

## 2021-03-05 DIAGNOSIS — H04123 Dry eye syndrome of bilateral lacrimal glands: Secondary | ICD-10-CM | POA: Diagnosis not present

## 2021-03-05 DIAGNOSIS — H35363 Drusen (degenerative) of macula, bilateral: Secondary | ICD-10-CM | POA: Diagnosis not present

## 2021-03-26 DIAGNOSIS — Z1211 Encounter for screening for malignant neoplasm of colon: Secondary | ICD-10-CM | POA: Diagnosis not present

## 2021-03-26 DIAGNOSIS — Z01419 Encounter for gynecological examination (general) (routine) without abnormal findings: Secondary | ICD-10-CM | POA: Diagnosis not present

## 2021-03-26 DIAGNOSIS — M8580 Other specified disorders of bone density and structure, unspecified site: Secondary | ICD-10-CM | POA: Diagnosis not present

## 2021-03-26 DIAGNOSIS — Z6830 Body mass index (BMI) 30.0-30.9, adult: Secondary | ICD-10-CM | POA: Diagnosis not present

## 2021-03-26 DIAGNOSIS — C73 Malignant neoplasm of thyroid gland: Secondary | ICD-10-CM | POA: Diagnosis not present

## 2021-03-26 DIAGNOSIS — C50911 Malignant neoplasm of unspecified site of right female breast: Secondary | ICD-10-CM | POA: Diagnosis not present

## 2021-03-26 DIAGNOSIS — L900 Lichen sclerosus et atrophicus: Secondary | ICD-10-CM | POA: Diagnosis not present

## 2021-03-31 DIAGNOSIS — W57XXXA Bitten or stung by nonvenomous insect and other nonvenomous arthropods, initial encounter: Secondary | ICD-10-CM | POA: Diagnosis not present

## 2021-03-31 DIAGNOSIS — S70362A Insect bite (nonvenomous), left thigh, initial encounter: Secondary | ICD-10-CM | POA: Diagnosis not present

## 2021-04-23 DIAGNOSIS — M5134 Other intervertebral disc degeneration, thoracic region: Secondary | ICD-10-CM | POA: Diagnosis not present

## 2021-04-23 DIAGNOSIS — M25551 Pain in right hip: Secondary | ICD-10-CM | POA: Diagnosis not present

## 2021-04-23 DIAGNOSIS — M5136 Other intervertebral disc degeneration, lumbar region: Secondary | ICD-10-CM | POA: Diagnosis not present

## 2021-05-01 DIAGNOSIS — Z0000 Encounter for general adult medical examination without abnormal findings: Secondary | ICD-10-CM | POA: Diagnosis not present

## 2021-05-01 DIAGNOSIS — Z1389 Encounter for screening for other disorder: Secondary | ICD-10-CM | POA: Diagnosis not present

## 2021-05-01 DIAGNOSIS — Z131 Encounter for screening for diabetes mellitus: Secondary | ICD-10-CM | POA: Diagnosis not present

## 2021-05-01 DIAGNOSIS — M5124 Other intervertebral disc displacement, thoracic region: Secondary | ICD-10-CM | POA: Diagnosis not present

## 2021-05-01 DIAGNOSIS — E039 Hypothyroidism, unspecified: Secondary | ICD-10-CM | POA: Diagnosis not present

## 2021-05-01 DIAGNOSIS — G43009 Migraine without aura, not intractable, without status migrainosus: Secondary | ICD-10-CM | POA: Diagnosis not present

## 2021-05-01 DIAGNOSIS — Z853 Personal history of malignant neoplasm of breast: Secondary | ICD-10-CM | POA: Diagnosis not present

## 2021-05-01 DIAGNOSIS — Z8669 Personal history of other diseases of the nervous system and sense organs: Secondary | ICD-10-CM | POA: Diagnosis not present

## 2021-05-01 DIAGNOSIS — R030 Elevated blood-pressure reading, without diagnosis of hypertension: Secondary | ICD-10-CM | POA: Diagnosis not present

## 2021-05-01 DIAGNOSIS — M8580 Other specified disorders of bone density and structure, unspecified site: Secondary | ICD-10-CM | POA: Diagnosis not present

## 2021-09-18 DIAGNOSIS — Z23 Encounter for immunization: Secondary | ICD-10-CM | POA: Diagnosis not present

## 2021-11-03 DIAGNOSIS — L821 Other seborrheic keratosis: Secondary | ICD-10-CM | POA: Diagnosis not present

## 2021-11-03 DIAGNOSIS — L719 Rosacea, unspecified: Secondary | ICD-10-CM | POA: Diagnosis not present

## 2021-11-03 DIAGNOSIS — L820 Inflamed seborrheic keratosis: Secondary | ICD-10-CM | POA: Diagnosis not present

## 2021-11-03 DIAGNOSIS — Z23 Encounter for immunization: Secondary | ICD-10-CM | POA: Diagnosis not present

## 2021-11-19 DIAGNOSIS — M5134 Other intervertebral disc degeneration, thoracic region: Secondary | ICD-10-CM | POA: Diagnosis not present

## 2021-11-19 DIAGNOSIS — M5136 Other intervertebral disc degeneration, lumbar region: Secondary | ICD-10-CM | POA: Diagnosis not present

## 2021-11-19 DIAGNOSIS — M25551 Pain in right hip: Secondary | ICD-10-CM | POA: Diagnosis not present

## 2022-05-04 DIAGNOSIS — E039 Hypothyroidism, unspecified: Secondary | ICD-10-CM | POA: Diagnosis not present

## 2022-05-06 DIAGNOSIS — M722 Plantar fascial fibromatosis: Secondary | ICD-10-CM | POA: Diagnosis not present

## 2022-05-12 DIAGNOSIS — Z23 Encounter for immunization: Secondary | ICD-10-CM | POA: Diagnosis not present

## 2022-05-12 DIAGNOSIS — Z1331 Encounter for screening for depression: Secondary | ICD-10-CM | POA: Diagnosis not present

## 2022-05-12 DIAGNOSIS — M722 Plantar fascial fibromatosis: Secondary | ICD-10-CM | POA: Diagnosis not present

## 2022-05-12 DIAGNOSIS — E039 Hypothyroidism, unspecified: Secondary | ICD-10-CM | POA: Diagnosis not present

## 2022-05-12 DIAGNOSIS — Z0000 Encounter for general adult medical examination without abnormal findings: Secondary | ICD-10-CM | POA: Diagnosis not present

## 2022-05-12 DIAGNOSIS — Z131 Encounter for screening for diabetes mellitus: Secondary | ICD-10-CM | POA: Diagnosis not present

## 2022-05-12 DIAGNOSIS — M8580 Other specified disorders of bone density and structure, unspecified site: Secondary | ICD-10-CM | POA: Diagnosis not present

## 2022-05-27 DIAGNOSIS — M722 Plantar fascial fibromatosis: Secondary | ICD-10-CM | POA: Diagnosis not present

## 2022-07-23 DIAGNOSIS — S86112A Strain of other muscle(s) and tendon(s) of posterior muscle group at lower leg level, left leg, initial encounter: Secondary | ICD-10-CM | POA: Diagnosis not present

## 2022-08-04 DIAGNOSIS — S86112D Strain of other muscle(s) and tendon(s) of posterior muscle group at lower leg level, left leg, subsequent encounter: Secondary | ICD-10-CM | POA: Diagnosis not present

## 2022-08-12 DIAGNOSIS — L821 Other seborrheic keratosis: Secondary | ICD-10-CM | POA: Diagnosis not present

## 2022-08-12 DIAGNOSIS — D485 Neoplasm of uncertain behavior of skin: Secondary | ICD-10-CM | POA: Diagnosis not present

## 2022-08-12 DIAGNOSIS — C44722 Squamous cell carcinoma of skin of right lower limb, including hip: Secondary | ICD-10-CM | POA: Diagnosis not present

## 2022-08-25 DIAGNOSIS — C44722 Squamous cell carcinoma of skin of right lower limb, including hip: Secondary | ICD-10-CM | POA: Diagnosis not present

## 2022-09-04 DIAGNOSIS — M722 Plantar fascial fibromatosis: Secondary | ICD-10-CM | POA: Diagnosis not present

## 2022-09-16 DIAGNOSIS — M722 Plantar fascial fibromatosis: Secondary | ICD-10-CM | POA: Diagnosis not present

## 2022-09-18 DIAGNOSIS — M722 Plantar fascial fibromatosis: Secondary | ICD-10-CM | POA: Diagnosis not present

## 2022-09-21 DIAGNOSIS — M722 Plantar fascial fibromatosis: Secondary | ICD-10-CM | POA: Diagnosis not present

## 2022-09-22 DIAGNOSIS — C44722 Squamous cell carcinoma of skin of right lower limb, including hip: Secondary | ICD-10-CM | POA: Diagnosis not present

## 2022-09-22 DIAGNOSIS — Z5189 Encounter for other specified aftercare: Secondary | ICD-10-CM | POA: Diagnosis not present

## 2022-09-23 DIAGNOSIS — M722 Plantar fascial fibromatosis: Secondary | ICD-10-CM | POA: Diagnosis not present

## 2022-09-29 DIAGNOSIS — M722 Plantar fascial fibromatosis: Secondary | ICD-10-CM | POA: Diagnosis not present

## 2022-10-01 DIAGNOSIS — Z23 Encounter for immunization: Secondary | ICD-10-CM | POA: Diagnosis not present

## 2022-10-01 DIAGNOSIS — M722 Plantar fascial fibromatosis: Secondary | ICD-10-CM | POA: Diagnosis not present

## 2022-10-05 DIAGNOSIS — M722 Plantar fascial fibromatosis: Secondary | ICD-10-CM | POA: Diagnosis not present

## 2022-10-07 DIAGNOSIS — M722 Plantar fascial fibromatosis: Secondary | ICD-10-CM | POA: Diagnosis not present

## 2022-10-16 DIAGNOSIS — M722 Plantar fascial fibromatosis: Secondary | ICD-10-CM | POA: Diagnosis not present

## 2022-10-29 DIAGNOSIS — Z8589 Personal history of malignant neoplasm of other organs and systems: Secondary | ICD-10-CM | POA: Diagnosis not present

## 2022-10-29 DIAGNOSIS — Z5189 Encounter for other specified aftercare: Secondary | ICD-10-CM | POA: Diagnosis not present

## 2022-10-29 DIAGNOSIS — C44722 Squamous cell carcinoma of skin of right lower limb, including hip: Secondary | ICD-10-CM | POA: Diagnosis not present

## 2023-03-02 DIAGNOSIS — H6993 Unspecified Eustachian tube disorder, bilateral: Secondary | ICD-10-CM | POA: Diagnosis not present

## 2023-05-11 DIAGNOSIS — E039 Hypothyroidism, unspecified: Secondary | ICD-10-CM | POA: Diagnosis not present

## 2023-05-19 DIAGNOSIS — N952 Postmenopausal atrophic vaginitis: Secondary | ICD-10-CM | POA: Diagnosis not present

## 2023-05-19 DIAGNOSIS — Z1331 Encounter for screening for depression: Secondary | ICD-10-CM | POA: Diagnosis not present

## 2023-05-19 DIAGNOSIS — M8580 Other specified disorders of bone density and structure, unspecified site: Secondary | ICD-10-CM | POA: Diagnosis not present

## 2023-05-19 DIAGNOSIS — E039 Hypothyroidism, unspecified: Secondary | ICD-10-CM | POA: Diagnosis not present

## 2023-05-19 DIAGNOSIS — Z6830 Body mass index (BMI) 30.0-30.9, adult: Secondary | ICD-10-CM | POA: Diagnosis not present

## 2023-05-19 DIAGNOSIS — H26491 Other secondary cataract, right eye: Secondary | ICD-10-CM | POA: Diagnosis not present

## 2023-05-19 DIAGNOSIS — Z131 Encounter for screening for diabetes mellitus: Secondary | ICD-10-CM | POA: Diagnosis not present

## 2023-05-19 DIAGNOSIS — H43813 Vitreous degeneration, bilateral: Secondary | ICD-10-CM | POA: Diagnosis not present

## 2023-05-19 DIAGNOSIS — Z23 Encounter for immunization: Secondary | ICD-10-CM | POA: Diagnosis not present

## 2023-05-19 DIAGNOSIS — M722 Plantar fascial fibromatosis: Secondary | ICD-10-CM | POA: Diagnosis not present

## 2023-05-19 DIAGNOSIS — Z0000 Encounter for general adult medical examination without abnormal findings: Secondary | ICD-10-CM | POA: Diagnosis not present

## 2023-05-24 ENCOUNTER — Other Ambulatory Visit: Payer: Self-pay | Admitting: Family Medicine

## 2023-05-24 DIAGNOSIS — M8580 Other specified disorders of bone density and structure, unspecified site: Secondary | ICD-10-CM

## 2023-07-28 DIAGNOSIS — H00025 Hordeolum internum left lower eyelid: Secondary | ICD-10-CM | POA: Diagnosis not present

## 2023-09-16 DIAGNOSIS — Z23 Encounter for immunization: Secondary | ICD-10-CM | POA: Diagnosis not present

## 2024-01-04 DIAGNOSIS — H00012 Hordeolum externum right lower eyelid: Secondary | ICD-10-CM | POA: Diagnosis not present

## 2024-01-24 ENCOUNTER — Ambulatory Visit
Admission: RE | Admit: 2024-01-24 | Discharge: 2024-01-24 | Disposition: A | Discharge status: Home / Self Care | Payer: Medicare Other | Source: Ambulatory Visit | Attending: Family Medicine | Admitting: Family Medicine

## 2024-01-24 DIAGNOSIS — M8580 Other specified disorders of bone density and structure, unspecified site: Secondary | ICD-10-CM

## 2024-01-24 DIAGNOSIS — M8588 Other specified disorders of bone density and structure, other site: Secondary | ICD-10-CM | POA: Diagnosis not present

## 2024-01-24 DIAGNOSIS — E2839 Other primary ovarian failure: Secondary | ICD-10-CM | POA: Diagnosis not present

## 2024-01-24 DIAGNOSIS — N958 Other specified menopausal and perimenopausal disorders: Secondary | ICD-10-CM | POA: Diagnosis not present

## 2024-05-18 DIAGNOSIS — M8580 Other specified disorders of bone density and structure, unspecified site: Secondary | ICD-10-CM | POA: Diagnosis not present

## 2024-05-18 DIAGNOSIS — E039 Hypothyroidism, unspecified: Secondary | ICD-10-CM | POA: Diagnosis not present

## 2024-05-25 DIAGNOSIS — Z6830 Body mass index (BMI) 30.0-30.9, adult: Secondary | ICD-10-CM | POA: Diagnosis not present

## 2024-05-25 DIAGNOSIS — M8580 Other specified disorders of bone density and structure, unspecified site: Secondary | ICD-10-CM | POA: Diagnosis not present

## 2024-05-25 DIAGNOSIS — E039 Hypothyroidism, unspecified: Secondary | ICD-10-CM | POA: Diagnosis not present

## 2024-05-25 DIAGNOSIS — N952 Postmenopausal atrophic vaginitis: Secondary | ICD-10-CM | POA: Diagnosis not present

## 2024-05-25 DIAGNOSIS — Z85850 Personal history of malignant neoplasm of thyroid: Secondary | ICD-10-CM | POA: Diagnosis not present

## 2024-05-25 DIAGNOSIS — Z0000 Encounter for general adult medical examination without abnormal findings: Secondary | ICD-10-CM | POA: Diagnosis not present

## 2024-05-25 DIAGNOSIS — H6121 Impacted cerumen, right ear: Secondary | ICD-10-CM | POA: Diagnosis not present

## 2024-05-25 DIAGNOSIS — Z853 Personal history of malignant neoplasm of breast: Secondary | ICD-10-CM | POA: Diagnosis not present

## 2024-09-20 DIAGNOSIS — Z23 Encounter for immunization: Secondary | ICD-10-CM | POA: Diagnosis not present

## 2024-11-22 DIAGNOSIS — G5711 Meralgia paresthetica, right lower limb: Secondary | ICD-10-CM | POA: Diagnosis not present

## 2024-11-22 DIAGNOSIS — M79661 Pain in right lower leg: Secondary | ICD-10-CM | POA: Diagnosis not present

## 2024-11-22 DIAGNOSIS — H6121 Impacted cerumen, right ear: Secondary | ICD-10-CM | POA: Diagnosis not present
